# Patient Record
Sex: Male | Born: 1970 | Race: White | Hispanic: No | Smoking: Current every day smoker
Health system: Southern US, Community
[De-identification: ages and names within clinical notes are randomized; demographics above are authoritative.]

## PROBLEM LIST (undated history)

## (undated) ENCOUNTER — Emergency Department (HOSPITAL_COMMUNITY): Payer: Self-pay

## (undated) DIAGNOSIS — J439 Emphysema, unspecified: Secondary | ICD-10-CM

## (undated) DIAGNOSIS — IMO0002 Reserved for concepts with insufficient information to code with codable children: Secondary | ICD-10-CM

## (undated) DIAGNOSIS — W3400XA Accidental discharge from unspecified firearms or gun, initial encounter: Secondary | ICD-10-CM

## (undated) HISTORY — PX: KNEE SURGERY: SHX244

## (undated) HISTORY — PX: OTHER SURGICAL HISTORY: SHX169

---

## 1997-07-10 ENCOUNTER — Emergency Department (HOSPITAL_COMMUNITY): Admission: EM | Admit: 1997-07-10 | Discharge: 1997-07-10 | Payer: Self-pay | Admitting: Emergency Medicine

## 2000-10-25 ENCOUNTER — Encounter: Payer: Self-pay | Admitting: Emergency Medicine

## 2000-10-25 ENCOUNTER — Emergency Department (HOSPITAL_COMMUNITY): Admission: EM | Admit: 2000-10-25 | Discharge: 2000-10-25 | Payer: Self-pay | Admitting: Emergency Medicine

## 2002-03-01 ENCOUNTER — Emergency Department (HOSPITAL_COMMUNITY): Admission: EM | Admit: 2002-03-01 | Discharge: 2002-03-01 | Payer: Self-pay | Admitting: Unknown Physician Specialty

## 2002-03-01 ENCOUNTER — Encounter: Payer: Self-pay | Admitting: Emergency Medicine

## 2002-06-09 ENCOUNTER — Emergency Department (HOSPITAL_COMMUNITY): Admission: EM | Admit: 2002-06-09 | Discharge: 2002-06-09 | Payer: Self-pay | Admitting: Emergency Medicine

## 2003-01-28 ENCOUNTER — Emergency Department (HOSPITAL_COMMUNITY): Admission: EM | Admit: 2003-01-28 | Discharge: 2003-01-28 | Payer: Self-pay | Admitting: Emergency Medicine

## 2003-04-19 ENCOUNTER — Emergency Department (HOSPITAL_COMMUNITY): Admission: EM | Admit: 2003-04-19 | Discharge: 2003-04-20 | Payer: Self-pay

## 2004-01-30 ENCOUNTER — Emergency Department (HOSPITAL_COMMUNITY): Admission: EM | Admit: 2004-01-30 | Discharge: 2004-01-30 | Payer: Self-pay | Admitting: Emergency Medicine

## 2004-09-21 ENCOUNTER — Emergency Department (HOSPITAL_COMMUNITY): Admission: EM | Admit: 2004-09-21 | Discharge: 2004-09-21 | Payer: Self-pay | Admitting: Emergency Medicine

## 2004-09-23 ENCOUNTER — Emergency Department (HOSPITAL_COMMUNITY): Admission: EM | Admit: 2004-09-23 | Discharge: 2004-09-23 | Payer: Self-pay | Admitting: Emergency Medicine

## 2004-10-28 ENCOUNTER — Emergency Department (HOSPITAL_COMMUNITY): Admission: EM | Admit: 2004-10-28 | Discharge: 2004-10-28 | Payer: Self-pay | Admitting: Emergency Medicine

## 2005-03-29 ENCOUNTER — Emergency Department (HOSPITAL_COMMUNITY): Admission: EM | Admit: 2005-03-29 | Discharge: 2005-03-29 | Payer: Self-pay | Admitting: Emergency Medicine

## 2005-09-07 ENCOUNTER — Emergency Department (HOSPITAL_COMMUNITY): Admission: EM | Admit: 2005-09-07 | Discharge: 2005-09-07 | Payer: Self-pay | Admitting: Emergency Medicine

## 2005-10-19 ENCOUNTER — Emergency Department (HOSPITAL_COMMUNITY): Admission: EM | Admit: 2005-10-19 | Discharge: 2005-10-19 | Payer: Self-pay | Admitting: *Deleted

## 2007-12-27 ENCOUNTER — Emergency Department (HOSPITAL_COMMUNITY): Admission: EM | Admit: 2007-12-27 | Discharge: 2007-12-27 | Payer: Self-pay | Admitting: Emergency Medicine

## 2008-01-01 ENCOUNTER — Emergency Department (HOSPITAL_COMMUNITY): Admission: EM | Admit: 2008-01-01 | Discharge: 2008-01-01 | Payer: Self-pay | Admitting: Emergency Medicine

## 2008-12-17 ENCOUNTER — Emergency Department (HOSPITAL_COMMUNITY): Admission: EM | Admit: 2008-12-17 | Discharge: 2008-12-17 | Payer: Self-pay | Admitting: Emergency Medicine

## 2009-02-21 ENCOUNTER — Emergency Department (HOSPITAL_COMMUNITY): Admission: EM | Admit: 2009-02-21 | Discharge: 2009-02-21 | Payer: Self-pay | Admitting: Emergency Medicine

## 2009-03-24 ENCOUNTER — Emergency Department (HOSPITAL_COMMUNITY): Admission: EM | Admit: 2009-03-24 | Discharge: 2009-03-24 | Payer: Self-pay | Admitting: Emergency Medicine

## 2009-09-28 ENCOUNTER — Emergency Department (HOSPITAL_COMMUNITY): Admission: EM | Admit: 2009-09-28 | Discharge: 2009-09-28 | Payer: Self-pay | Admitting: Emergency Medicine

## 2010-02-20 ENCOUNTER — Emergency Department (HOSPITAL_COMMUNITY)
Admission: EM | Admit: 2010-02-20 | Discharge: 2010-02-20 | Payer: Self-pay | Source: Home / Self Care | Admitting: Emergency Medicine

## 2010-03-24 ENCOUNTER — Emergency Department (HOSPITAL_COMMUNITY): Payer: Self-pay

## 2010-03-24 ENCOUNTER — Emergency Department (HOSPITAL_COMMUNITY)
Admission: EM | Admit: 2010-03-24 | Discharge: 2010-03-24 | Disposition: A | Discharge status: Home / Self Care | Payer: Self-pay | Attending: Emergency Medicine | Admitting: Emergency Medicine

## 2010-03-24 DIAGNOSIS — R079 Chest pain, unspecified: Secondary | ICD-10-CM | POA: Insufficient documentation

## 2010-03-24 DIAGNOSIS — K219 Gastro-esophageal reflux disease without esophagitis: Secondary | ICD-10-CM | POA: Insufficient documentation

## 2010-03-24 DIAGNOSIS — M542 Cervicalgia: Secondary | ICD-10-CM | POA: Insufficient documentation

## 2010-04-10 ENCOUNTER — Emergency Department (HOSPITAL_COMMUNITY)
Admission: EM | Admit: 2010-04-10 | Discharge: 2010-04-10 | Disposition: A | Discharge status: Home / Self Care | Payer: Self-pay | Attending: Emergency Medicine | Admitting: Emergency Medicine

## 2010-04-10 DIAGNOSIS — F17200 Nicotine dependence, unspecified, uncomplicated: Secondary | ICD-10-CM | POA: Insufficient documentation

## 2010-04-10 DIAGNOSIS — R079 Chest pain, unspecified: Secondary | ICD-10-CM | POA: Insufficient documentation

## 2010-04-10 DIAGNOSIS — M79609 Pain in unspecified limb: Secondary | ICD-10-CM | POA: Insufficient documentation

## 2010-04-10 DIAGNOSIS — M542 Cervicalgia: Secondary | ICD-10-CM | POA: Insufficient documentation

## 2010-04-10 DIAGNOSIS — K219 Gastro-esophageal reflux disease without esophagitis: Secondary | ICD-10-CM | POA: Insufficient documentation

## 2010-04-10 DIAGNOSIS — Z79899 Other long term (current) drug therapy: Secondary | ICD-10-CM | POA: Insufficient documentation

## 2010-04-10 LAB — POCT CARDIAC MARKERS: Myoglobin, poc: 74.5 ng/mL (ref 12–200)

## 2010-04-10 LAB — POCT I-STAT, CHEM 8
BUN: 11 mg/dL (ref 6–23)
Chloride: 105 mEq/L (ref 96–112)
Creatinine, Ser: 1.2 mg/dL (ref 0.4–1.5)
HCT: 44 % (ref 39.0–52.0)
Hemoglobin: 15 g/dL (ref 13.0–17.0)
Potassium: 4 mEq/L (ref 3.5–5.1)
Sodium: 138 mEq/L (ref 135–145)

## 2010-04-10 LAB — D-DIMER, QUANTITATIVE: D-Dimer, Quant: <0.22 ug/mL-FEU (ref 0.00–0.48)

## 2010-04-29 ENCOUNTER — Emergency Department (HOSPITAL_COMMUNITY)
Admission: EM | Admit: 2010-04-29 | Discharge: 2010-04-29 | Discharge status: Against Medical Advice | Payer: Self-pay | Attending: Emergency Medicine | Admitting: Emergency Medicine

## 2010-04-29 ENCOUNTER — Emergency Department (HOSPITAL_COMMUNITY): Payer: Self-pay

## 2010-04-29 DIAGNOSIS — M25519 Pain in unspecified shoulder: Secondary | ICD-10-CM | POA: Insufficient documentation

## 2010-04-29 DIAGNOSIS — R0789 Other chest pain: Secondary | ICD-10-CM | POA: Insufficient documentation

## 2010-04-29 DIAGNOSIS — M542 Cervicalgia: Secondary | ICD-10-CM | POA: Insufficient documentation

## 2010-04-29 DIAGNOSIS — M546 Pain in thoracic spine: Secondary | ICD-10-CM | POA: Insufficient documentation

## 2010-04-29 DIAGNOSIS — K219 Gastro-esophageal reflux disease without esophagitis: Secondary | ICD-10-CM | POA: Insufficient documentation

## 2010-04-29 DIAGNOSIS — K279 Peptic ulcer, site unspecified, unspecified as acute or chronic, without hemorrhage or perforation: Secondary | ICD-10-CM | POA: Insufficient documentation

## 2010-08-10 ENCOUNTER — Emergency Department (HOSPITAL_COMMUNITY)
Admission: EM | Admit: 2010-08-10 | Discharge: 2010-08-10 | Disposition: A | Discharge status: Home / Self Care | Payer: Self-pay | Attending: Emergency Medicine | Admitting: Emergency Medicine

## 2010-08-10 DIAGNOSIS — L255 Unspecified contact dermatitis due to plants, except food: Secondary | ICD-10-CM | POA: Insufficient documentation

## 2010-08-10 DIAGNOSIS — R21 Rash and other nonspecific skin eruption: Secondary | ICD-10-CM | POA: Insufficient documentation

## 2010-08-10 DIAGNOSIS — K219 Gastro-esophageal reflux disease without esophagitis: Secondary | ICD-10-CM | POA: Insufficient documentation

## 2010-08-10 DIAGNOSIS — T622X1A Toxic effect of other ingested (parts of) plant(s), accidental (unintentional), initial encounter: Secondary | ICD-10-CM | POA: Insufficient documentation

## 2010-08-31 ENCOUNTER — Emergency Department (HOSPITAL_COMMUNITY)
Admission: EM | Admit: 2010-08-31 | Discharge: 2010-08-31 | Discharge status: Against Medical Advice | Payer: Self-pay | Attending: Emergency Medicine | Admitting: Emergency Medicine

## 2010-08-31 DIAGNOSIS — R109 Unspecified abdominal pain: Secondary | ICD-10-CM | POA: Insufficient documentation

## 2010-08-31 LAB — URINALYSIS, ROUTINE W REFLEX MICROSCOPIC
Bilirubin Urine: NEGATIVE
Glucose, UA: NEGATIVE mg/dL
Ketones, ur: NEGATIVE mg/dL
Protein, ur: NEGATIVE mg/dL

## 2010-09-05 ENCOUNTER — Emergency Department (HOSPITAL_COMMUNITY)
Admission: EM | Admit: 2010-09-05 | Discharge: 2010-09-05 | Disposition: A | Discharge status: Home / Self Care | Payer: Self-pay | Attending: Emergency Medicine | Admitting: Emergency Medicine

## 2010-09-05 DIAGNOSIS — R1110 Vomiting, unspecified: Secondary | ICD-10-CM | POA: Insufficient documentation

## 2010-09-05 DIAGNOSIS — K219 Gastro-esophageal reflux disease without esophagitis: Secondary | ICD-10-CM | POA: Insufficient documentation

## 2010-09-05 DIAGNOSIS — F17200 Nicotine dependence, unspecified, uncomplicated: Secondary | ICD-10-CM | POA: Insufficient documentation

## 2010-09-05 DIAGNOSIS — R079 Chest pain, unspecified: Secondary | ICD-10-CM | POA: Insufficient documentation

## 2010-09-05 DIAGNOSIS — R109 Unspecified abdominal pain: Secondary | ICD-10-CM | POA: Insufficient documentation

## 2010-09-05 LAB — URINALYSIS, ROUTINE W REFLEX MICROSCOPIC
Bilirubin Urine: NEGATIVE
Glucose, UA: NEGATIVE mg/dL
Hgb urine dipstick: NEGATIVE
Ketones, ur: NEGATIVE mg/dL
Protein, ur: NEGATIVE mg/dL
Specific Gravity, Urine: 1.008 (ref 1.005–1.030)
pH: 6.5 (ref 5.0–8.0)

## 2010-09-15 ENCOUNTER — Inpatient Hospital Stay (INDEPENDENT_AMBULATORY_CARE_PROVIDER_SITE_OTHER)
Admission: RE | Admit: 2010-09-15 | Discharge: 2010-09-15 | Disposition: A | Discharge status: Home / Self Care | Payer: Self-pay | Source: Ambulatory Visit | Attending: Family Medicine | Admitting: Family Medicine

## 2010-09-15 DIAGNOSIS — K219 Gastro-esophageal reflux disease without esophagitis: Secondary | ICD-10-CM

## 2010-09-15 LAB — POCT I-STAT, CHEM 8
Calcium, Ion: 1.14 mmol/L (ref 1.12–1.32)
Chloride: 105 mEq/L (ref 96–112)
Creatinine, Ser: 1.2 mg/dL (ref 0.50–1.35)
Hemoglobin: 15.6 g/dL (ref 13.0–17.0)
Potassium: 3.8 mEq/L (ref 3.5–5.1)
TCO2: 25 mmol/L (ref 0–100)

## 2010-11-05 ENCOUNTER — Emergency Department (HOSPITAL_COMMUNITY): Payer: Self-pay

## 2010-11-05 ENCOUNTER — Emergency Department (HOSPITAL_COMMUNITY)
Admission: EM | Admit: 2010-11-05 | Discharge: 2010-11-05 | Disposition: A | Discharge status: Home / Self Care | Payer: Self-pay | Attending: Emergency Medicine | Admitting: Emergency Medicine

## 2010-11-05 DIAGNOSIS — R059 Cough, unspecified: Secondary | ICD-10-CM | POA: Insufficient documentation

## 2010-11-05 DIAGNOSIS — R5381 Other malaise: Secondary | ICD-10-CM | POA: Insufficient documentation

## 2010-11-05 DIAGNOSIS — R05 Cough: Secondary | ICD-10-CM | POA: Insufficient documentation

## 2010-11-05 DIAGNOSIS — F17200 Nicotine dependence, unspecified, uncomplicated: Secondary | ICD-10-CM | POA: Insufficient documentation

## 2010-11-05 DIAGNOSIS — R0609 Other forms of dyspnea: Secondary | ICD-10-CM | POA: Insufficient documentation

## 2010-11-05 DIAGNOSIS — R071 Chest pain on breathing: Secondary | ICD-10-CM | POA: Insufficient documentation

## 2010-11-05 DIAGNOSIS — R0989 Other specified symptoms and signs involving the circulatory and respiratory systems: Secondary | ICD-10-CM | POA: Insufficient documentation

## 2010-11-05 DIAGNOSIS — IMO0001 Reserved for inherently not codable concepts without codable children: Secondary | ICD-10-CM | POA: Insufficient documentation

## 2010-11-05 DIAGNOSIS — B9789 Other viral agents as the cause of diseases classified elsewhere: Secondary | ICD-10-CM | POA: Insufficient documentation

## 2010-11-05 LAB — CBC
MCV: 93.2 fL (ref 78.0–100.0)
Platelets: 211 10*3/uL (ref 150–400)
RBC: 4.86 MIL/uL (ref 4.22–5.81)
RDW: 12.7 % (ref 11.5–15.5)
WBC: 8.3 10*3/uL (ref 4.0–10.5)

## 2010-11-05 LAB — DIFFERENTIAL
Eosinophils Relative: 2 % (ref 0–5)
Monocytes Absolute: 0.8 10*3/uL (ref 0.1–1.0)
Neutro Abs: 5.2 10*3/uL (ref 1.7–7.7)
Neutrophils Relative %: 63 % (ref 43–77)

## 2010-11-05 LAB — BASIC METABOLIC PANEL
Chloride: 103 mEq/L (ref 96–112)
GFR calc non Af Amer: >90 mL/min (ref >90)
Sodium: 136 mEq/L (ref 135–145)

## 2010-12-01 ENCOUNTER — Emergency Department (HOSPITAL_COMMUNITY)
Admission: EM | Admit: 2010-12-01 | Discharge: 2010-12-01 | Disposition: A | Discharge status: Home / Self Care | Payer: Self-pay | Attending: Emergency Medicine | Admitting: Emergency Medicine

## 2010-12-01 ENCOUNTER — Encounter: Payer: Self-pay | Admitting: *Deleted

## 2010-12-01 DIAGNOSIS — R21 Rash and other nonspecific skin eruption: Secondary | ICD-10-CM | POA: Insufficient documentation

## 2010-12-01 DIAGNOSIS — B354 Tinea corporis: Secondary | ICD-10-CM | POA: Insufficient documentation

## 2010-12-01 HISTORY — DX: Accidental discharge from unspecified firearms or gun, initial encounter: W34.00XA
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
## 2010-12-01 NOTE — ED Notes (Signed)
Pt presents to department for evaluation of possible ringworm. Ongoing x3 weeks, was told this could be what he has. No relief from itching and irritation. Lesions noted to bilateral inner thigh areas. 4/10 discomfort at the time. Pt alert and oriented x4. No signs of distress.

## 2010-12-01 NOTE — ED Notes (Signed)
Pt states he does not want prescription, will continue using creams at home. Left without signing discharge instructions. EDP at bedside.

## 2011-04-03 ENCOUNTER — Encounter (HOSPITAL_COMMUNITY): Payer: Self-pay | Admitting: *Deleted

## 2011-04-03 ENCOUNTER — Other Ambulatory Visit: Payer: Self-pay

## 2011-04-03 ENCOUNTER — Emergency Department (HOSPITAL_COMMUNITY)
Admission: EM | Admit: 2011-04-03 | Discharge: 2011-04-03 | Disposition: A | Discharge status: Home / Self Care | Payer: Self-pay | Attending: Emergency Medicine | Admitting: Emergency Medicine

## 2011-04-03 ENCOUNTER — Emergency Department (HOSPITAL_COMMUNITY): Payer: Self-pay

## 2011-04-03 DIAGNOSIS — J189 Pneumonia, unspecified organism: Secondary | ICD-10-CM | POA: Insufficient documentation

## 2011-04-03 DIAGNOSIS — R059 Cough, unspecified: Secondary | ICD-10-CM | POA: Insufficient documentation

## 2011-04-03 DIAGNOSIS — F17200 Nicotine dependence, unspecified, uncomplicated: Secondary | ICD-10-CM | POA: Insufficient documentation

## 2011-04-03 DIAGNOSIS — R109 Unspecified abdominal pain: Secondary | ICD-10-CM | POA: Insufficient documentation

## 2011-04-03 DIAGNOSIS — Z79899 Other long term (current) drug therapy: Secondary | ICD-10-CM | POA: Insufficient documentation

## 2011-04-03 DIAGNOSIS — R05 Cough: Secondary | ICD-10-CM | POA: Insufficient documentation

## 2011-04-03 HISTORY — DX: Reserved for concepts with insufficient information to code with codable children: IMO0002

## 2011-04-03 LAB — COMPREHENSIVE METABOLIC PANEL
ALT: 39 U/L (ref 0–53)
AST: 30 U/L (ref 0–37)
Albumin: 3.8 g/dL (ref 3.5–5.2)
Alkaline Phosphatase: 63 U/L (ref 39–117)
Chloride: 103 mEq/L (ref 96–112)
Potassium: 4 mEq/L (ref 3.5–5.1)
Sodium: 138 mEq/L (ref 135–145)
Total Bilirubin: 0.2 mg/dL — ABNORMAL LOW (ref 0.3–1.2)
Total Protein: 7.1 g/dL (ref 6.0–8.3)

## 2011-04-03 LAB — CBC
Hemoglobin: 15.5 g/dL (ref 13.0–17.0)
MCH: 32.7 pg (ref 26.0–34.0)
MCHC: 35.1 g/dL (ref 30.0–36.0)
Platelets: 215 10*3/uL (ref 150–400)
RBC: 4.74 MIL/uL (ref 4.22–5.81)

## 2011-04-03 LAB — DIFFERENTIAL
Basophils Relative: 1 % (ref 0–1)
Eosinophils Absolute: 0.1 10*3/uL (ref 0.0–0.7)
Monocytes Relative: 8 % (ref 3–12)
Neutro Abs: 3.3 10*3/uL (ref 1.7–7.7)
Neutrophils Relative %: 47 % (ref 43–77)

## 2011-04-03 MED ORDER — AZITHROMYCIN 250 MG PO TABS: 500.0000 mg | ORAL_TABLET | Freq: Every day | ORAL | Status: AC

## 2011-04-03 MED ORDER — AZITHROMYCIN 250 MG PO TABS
500.0000 mg | ORAL_TABLET | Freq: Once | ORAL | Status: AC
  Administered 2011-04-03: 500 mg via ORAL
  Filled 2011-04-03: qty 2

## 2011-04-03 MED ORDER — GI COCKTAIL ~~LOC~~
30.0000 mL | Freq: Once | ORAL | Status: AC
  Administered 2011-04-03: 30 mL via ORAL
  Filled 2011-04-03: qty 30

## 2011-04-03 NOTE — ED Notes (Signed)
The pt has had epigastric pain for the past 2 days with n v.  The pain has advanced up his lt chest and into his neck intermittently.  No cardiac history
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
## 2011-04-03 NOTE — Discharge Instructions (Signed)

## 2012-07-29 ENCOUNTER — Emergency Department (HOSPITAL_COMMUNITY)
Admission: EM | Admit: 2012-07-29 | Discharge: 2012-07-29 | Disposition: A | Discharge status: Home / Self Care | Payer: Self-pay | Attending: Emergency Medicine | Admitting: Emergency Medicine

## 2012-07-29 ENCOUNTER — Encounter (HOSPITAL_COMMUNITY): Payer: Self-pay | Admitting: Emergency Medicine

## 2012-07-29 DIAGNOSIS — G8929 Other chronic pain: Secondary | ICD-10-CM | POA: Insufficient documentation

## 2012-07-29 DIAGNOSIS — M7989 Other specified soft tissue disorders: Secondary | ICD-10-CM | POA: Insufficient documentation

## 2012-07-29 DIAGNOSIS — R079 Chest pain, unspecified: Secondary | ICD-10-CM | POA: Insufficient documentation

## 2012-07-29 DIAGNOSIS — Z8719 Personal history of other diseases of the digestive system: Secondary | ICD-10-CM | POA: Insufficient documentation

## 2012-07-29 DIAGNOSIS — Z79899 Other long term (current) drug therapy: Secondary | ICD-10-CM | POA: Insufficient documentation

## 2012-07-29 DIAGNOSIS — M79609 Pain in unspecified limb: Secondary | ICD-10-CM

## 2012-07-29 DIAGNOSIS — F17200 Nicotine dependence, unspecified, uncomplicated: Secondary | ICD-10-CM | POA: Insufficient documentation

## 2012-07-29 DIAGNOSIS — Z87828 Personal history of other (healed) physical injury and trauma: Secondary | ICD-10-CM | POA: Insufficient documentation

## 2012-07-29 DIAGNOSIS — R109 Unspecified abdominal pain: Secondary | ICD-10-CM | POA: Insufficient documentation

## 2012-07-29 MED ORDER — HYDROCODONE-ACETAMINOPHEN 5-325 MG PO TABS: 1.0000 | ORAL_TABLET | ORAL | Status: DC | PRN
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
## 2012-07-29 NOTE — ED Notes (Signed)
Venous Duplex study being done at this time.

## 2012-07-29 NOTE — ED Provider Notes (Signed)
Medical screening examination/treatment/procedure(s) were performed by non-physician practitioner and as supervising physician I was immediately available for consultation/collaboration.  Doug Sou, MD 07/29/12 [phone]
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
## 2012-07-29 NOTE — ED Notes (Signed)
Pt reports redness and pain to left leg that started 3 days ago. Pt reports pain from Upper leg to to lower leg. Pt took 5 mg Vicodin at 1230 and pain is reduced from 8/10 to 4/10 now. Pt ambulated with a limp.

## 2012-07-30 NOTE — ED Provider Notes (Signed)
Medical screening examination/treatment/procedure(s) were performed by non-physician practitioner and as supervising physician I was immediately available for consultation/collaboration.  Doug Sou, MD 07/30/12 1544

## 2012-07-31 ENCOUNTER — Encounter (HOSPITAL_COMMUNITY): Payer: Self-pay | Admitting: Emergency Medicine

## 2012-07-31 ENCOUNTER — Emergency Department (HOSPITAL_COMMUNITY)
Admission: EM | Admit: 2012-07-31 | Discharge: 2012-07-31 | Disposition: A | Discharge status: Home / Self Care | Payer: Self-pay | Attending: Emergency Medicine | Admitting: Emergency Medicine

## 2012-07-31 DIAGNOSIS — M79605 Pain in left leg: Secondary | ICD-10-CM

## 2012-07-31 DIAGNOSIS — Z79899 Other long term (current) drug therapy: Secondary | ICD-10-CM | POA: Insufficient documentation

## 2012-07-31 DIAGNOSIS — R000 Tachycardia, unspecified: Secondary | ICD-10-CM | POA: Insufficient documentation

## 2012-07-31 DIAGNOSIS — F17200 Nicotine dependence, unspecified, uncomplicated: Secondary | ICD-10-CM | POA: Insufficient documentation

## 2012-07-31 DIAGNOSIS — G8929 Other chronic pain: Secondary | ICD-10-CM | POA: Insufficient documentation

## 2012-07-31 DIAGNOSIS — M79609 Pain in unspecified limb: Secondary | ICD-10-CM

## 2012-07-31 MED ORDER — IBUPROFEN 800 MG PO TABS: 800.0000 mg | ORAL_TABLET | Freq: Three times a day (TID) | ORAL | Status: DC

## 2012-07-31 NOTE — ED Notes (Signed)
Vascular surgery at bedside for evaluation
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
## 2012-08-01 NOTE — ED Provider Notes (Signed)
Medical screening examination/treatment/procedure(s) were performed by non-physician practitioner and as supervising physician I was immediately available for consultation/collaboration.  Tamaya Pun R. Yoshimi Sarr, MD 08/01/12 0738 

## 2012-09-14 ENCOUNTER — Emergency Department (HOSPITAL_COMMUNITY): Payer: Self-pay

## 2012-09-14 ENCOUNTER — Encounter (HOSPITAL_COMMUNITY): Payer: Self-pay | Admitting: *Deleted

## 2012-09-14 ENCOUNTER — Emergency Department (HOSPITAL_COMMUNITY)
Admission: EM | Admit: 2012-09-14 | Discharge: 2012-09-14 | Disposition: A | Discharge status: Home / Self Care | Payer: Self-pay | Attending: Emergency Medicine | Admitting: Emergency Medicine

## 2012-09-14 DIAGNOSIS — Z8679 Personal history of other diseases of the circulatory system: Secondary | ICD-10-CM | POA: Insufficient documentation

## 2012-09-14 DIAGNOSIS — F1410 Cocaine abuse, uncomplicated: Secondary | ICD-10-CM | POA: Insufficient documentation

## 2012-09-14 DIAGNOSIS — Z87828 Personal history of other (healed) physical injury and trauma: Secondary | ICD-10-CM | POA: Insufficient documentation

## 2012-09-14 DIAGNOSIS — R05 Cough: Secondary | ICD-10-CM | POA: Insufficient documentation

## 2012-09-14 DIAGNOSIS — R059 Cough, unspecified: Secondary | ICD-10-CM | POA: Insufficient documentation

## 2012-09-14 DIAGNOSIS — Z79899 Other long term (current) drug therapy: Secondary | ICD-10-CM | POA: Insufficient documentation

## 2012-09-14 DIAGNOSIS — F17200 Nicotine dependence, unspecified, uncomplicated: Secondary | ICD-10-CM | POA: Insufficient documentation

## 2012-09-14 DIAGNOSIS — R079 Chest pain, unspecified: Secondary | ICD-10-CM | POA: Insufficient documentation

## 2012-09-14 DIAGNOSIS — Z872 Personal history of diseases of the skin and subcutaneous tissue: Secondary | ICD-10-CM | POA: Insufficient documentation

## 2012-09-14 DIAGNOSIS — R000 Tachycardia, unspecified: Secondary | ICD-10-CM | POA: Insufficient documentation

## 2012-09-14 LAB — CBC
HCT: 41.5 % (ref 39.0–52.0)
Hemoglobin: 14.8 g/dL (ref 13.0–17.0)
MCH: 33.6 pg (ref 26.0–34.0)
MCHC: 35.7 g/dL (ref 30.0–36.0)
MCV: 94.3 fL (ref 78.0–100.0)
RDW: 12.6 % (ref 11.5–15.5)

## 2012-09-14 LAB — POCT I-STAT TROPONIN I

## 2012-09-14 LAB — POCT I-STAT, CHEM 8
BUN: 14 mg/dL (ref 6–23)
Calcium, Ion: 1.23 mmol/L (ref 1.12–1.23)
Creatinine, Ser: 1.4 mg/dL — ABNORMAL HIGH (ref 0.50–1.35)
Glucose, Bld: 79 mg/dL (ref 70–99)
Sodium: 140 mEq/L (ref 135–145)
TCO2: 27 mmol/L (ref 0–100)

## 2012-09-14 LAB — BASIC METABOLIC PANEL
CO2: 26 mEq/L (ref 19–32)
Chloride: 101 mEq/L (ref 96–112)
Creatinine, Ser: 1.28 mg/dL (ref 0.50–1.35)
Glucose, Bld: 70 mg/dL (ref 70–99)

## 2012-09-14 MED ORDER — SODIUM CHLORIDE 0.9 % IV BOLUS (SEPSIS)
1000.0000 mL | Freq: Once | INTRAVENOUS | Status: AC
  Administered 2012-09-14: 1000 mL via INTRAVENOUS

## 2012-09-14 MED ORDER — IOHEXOL 350 MG/ML SOLN
80.0000 mL | Freq: Once | INTRAVENOUS | Status: AC | PRN
  Administered 2012-09-14: 80 mL via INTRAVENOUS

## 2012-09-14 NOTE — ED Notes (Signed)
Pt states he has been to ED MANY times for same complaint.  L pectoral muscle "burns" or feels like "there is something there".  Pt refuses ekg b/c he states every time the has one it is normal.  Complains also of dry cough and bil scapular pain.  Pain increases with activity.  States every time he comes to ED we refer him to a specialist but he does not have a job and cannot pay, so has not seen a specialist.  He requests and X-ray or MRI.

## 2012-09-14 NOTE — ED Notes (Signed)
Was able to convince to to allow ED EKG after discussing benefits and risks of procedure
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
## 2012-09-14 NOTE — ED Notes (Signed)
Pt scanning pad not working.  Pt unable to sign for discharge

## 2013-06-10 ENCOUNTER — Emergency Department (HOSPITAL_COMMUNITY)
Admission: EM | Admit: 2013-06-10 | Discharge: 2013-06-10 | Disposition: A | Discharge status: Home / Self Care | Payer: Self-pay | Attending: Emergency Medicine | Admitting: Emergency Medicine

## 2013-06-10 ENCOUNTER — Emergency Department (HOSPITAL_COMMUNITY): Payer: Self-pay

## 2013-06-10 ENCOUNTER — Encounter (HOSPITAL_COMMUNITY): Payer: Self-pay | Admitting: Emergency Medicine

## 2013-06-10 DIAGNOSIS — Z87828 Personal history of other (healed) physical injury and trauma: Secondary | ICD-10-CM | POA: Insufficient documentation

## 2013-06-10 DIAGNOSIS — R1033 Periumbilical pain: Secondary | ICD-10-CM | POA: Insufficient documentation

## 2013-06-10 DIAGNOSIS — R1110 Vomiting, unspecified: Secondary | ICD-10-CM | POA: Insufficient documentation

## 2013-06-10 DIAGNOSIS — Z79899 Other long term (current) drug therapy: Secondary | ICD-10-CM | POA: Insufficient documentation

## 2013-06-10 DIAGNOSIS — Z872 Personal history of diseases of the skin and subcutaneous tissue: Secondary | ICD-10-CM | POA: Insufficient documentation

## 2013-06-10 DIAGNOSIS — R51 Headache: Secondary | ICD-10-CM | POA: Insufficient documentation

## 2013-06-10 DIAGNOSIS — R1011 Right upper quadrant pain: Secondary | ICD-10-CM | POA: Insufficient documentation

## 2013-06-10 DIAGNOSIS — F17200 Nicotine dependence, unspecified, uncomplicated: Secondary | ICD-10-CM | POA: Insufficient documentation

## 2013-06-10 DIAGNOSIS — R109 Unspecified abdominal pain: Secondary | ICD-10-CM

## 2013-06-10 LAB — COMPREHENSIVE METABOLIC PANEL
ALBUMIN: 3.6 g/dL (ref 3.5–5.2)
ALK PHOS: 59 U/L (ref 39–117)
ALT: 23 U/L (ref 0–53)
AST: 24 U/L (ref 0–37)
BILIRUBIN TOTAL: 0.4 mg/dL (ref 0.3–1.2)
BUN: 15 mg/dL (ref 6–23)
CHLORIDE: 102 meq/L (ref 96–112)
CO2: 23 meq/L (ref 19–32)
Calcium: 8.7 mg/dL (ref 8.4–10.5)
Creatinine, Ser: 1.14 mg/dL (ref 0.50–1.35)
GFR calc Af Amer: >90 mL/min (ref >90)
GFR, EST NON AFRICAN AMERICAN: 79 mL/min — AB (ref >90)
Glucose, Bld: 88 mg/dL (ref 70–99)
POTASSIUM: 4.1 meq/L (ref 3.7–5.3)
SODIUM: 138 meq/L (ref 137–147)
Total Protein: 6.5 g/dL (ref 6.0–8.3)

## 2013-06-10 LAB — URINALYSIS, ROUTINE W REFLEX MICROSCOPIC
BILIRUBIN URINE: NEGATIVE
Glucose, UA: NEGATIVE mg/dL
Hgb urine dipstick: NEGATIVE
KETONES UR: NEGATIVE mg/dL
Leukocytes, UA: NEGATIVE
NITRITE: NEGATIVE
PH: 6.5 (ref 5.0–8.0)
PROTEIN: NEGATIVE mg/dL
Specific Gravity, Urine: 1.028 (ref 1.005–1.030)
Urobilinogen, UA: 0.2 mg/dL (ref 0.0–1.0)

## 2013-06-10 LAB — CBC WITH DIFFERENTIAL/PLATELET
BASOS ABS: 0.1 10*3/uL (ref 0.0–0.1)
Basophils Relative: 1 % (ref 0–1)
EOS ABS: 0.2 10*3/uL (ref 0.0–0.7)
EOS PCT: 2 % (ref 0–5)
HEMATOCRIT: 43.2 % (ref 39.0–52.0)
Hemoglobin: 14.5 g/dL (ref 13.0–17.0)
LYMPHS PCT: 33 % (ref 12–46)
Lymphs Abs: 2.4 10*3/uL (ref 0.7–4.0)
MCH: 32.4 pg (ref 26.0–34.0)
MCHC: 33.6 g/dL (ref 30.0–36.0)
MCV: 96.6 fL (ref 78.0–100.0)
MONO ABS: 0.5 10*3/uL (ref 0.1–1.0)
Monocytes Relative: 7 % (ref 3–12)
Neutro Abs: 4.3 10*3/uL (ref 1.7–7.7)
Neutrophils Relative %: 57 % (ref 43–77)
PLATELETS: 204 10*3/uL (ref 150–400)
RBC: 4.47 MIL/uL (ref 4.22–5.81)
RDW: 12.7 % (ref 11.5–15.5)
WBC: 7.5 10*3/uL (ref 4.0–10.5)

## 2013-06-10 LAB — LIPASE, BLOOD: Lipase: 34 U/L (ref 11–59)

## 2013-06-10 MED ORDER — SODIUM CHLORIDE 0.9 % IV BOLUS (SEPSIS)
500.0000 mL | Freq: Once | INTRAVENOUS | Status: AC
  Administered 2013-06-10: 500 mL via INTRAVENOUS

## 2013-06-10 MED ORDER — HYDROCODONE-ACETAMINOPHEN 5-325 MG PO TABS: 1.0000 | ORAL_TABLET | Freq: Four times a day (QID) | ORAL | Status: DC | PRN

## 2013-06-10 MED ORDER — ONDANSETRON HCL 4 MG/2ML IJ SOLN
4.0000 mg | Freq: Once | INTRAMUSCULAR | Status: AC
  Administered 2013-06-10: 4 mg via INTRAVENOUS
  Filled 2013-06-10: qty 2

## 2013-06-10 MED ORDER — IOHEXOL 300 MG/ML  SOLN
100.0000 mL | Freq: Once | INTRAMUSCULAR | Status: AC | PRN
  Administered 2013-06-10: 100 mL via INTRAVENOUS

## 2013-06-10 MED ORDER — SODIUM CHLORIDE 0.9 % IV SOLN
INTRAVENOUS | Status: DC
  Administered 2013-06-10: 10:00:00 via INTRAVENOUS

## 2013-06-10 MED ORDER — IOHEXOL 300 MG/ML  SOLN: 25.0000 mL | Freq: Once | INTRAMUSCULAR | Status: DC | PRN

## 2013-06-10 MED ORDER — FAMOTIDINE 20 MG PO TABS: 20.0000 mg | ORAL_TABLET | Freq: Two times a day (BID) | ORAL | Status: DC

## 2013-06-10 MED ORDER — HYDROMORPHONE HCL PF 1 MG/ML IJ SOLN
1.0000 mg | Freq: Once | INTRAMUSCULAR | Status: DC
  Filled 2013-06-10: qty 1

## 2013-06-10 NOTE — Discharge Instructions (Signed)
Extensive workup for the abdominal pain without any significant findings. Head CT was also normal. Will give you a trial of Pepcid this maybe stomach ulcer problem. Also pain medication as needed. Resource guide provided to help you find a record Dr. Return for any newer worse symptoms.

## 2013-06-10 NOTE — ED Notes (Signed)
Labs delayed due to pt not available in the room.

## 2013-06-10 NOTE — ED Notes (Signed)
Pt returned from CT °
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
## 2013-06-10 NOTE — ED Notes (Signed)
Pt presents from home with c/o of lower abdominal pain x 1 week and pt feels he has a mass underneath his right rib cage that has been ongoing x 3 weeks.

## 2013-06-10 NOTE — ED Notes (Signed)
Pt has tenderness to the right lower rib cage.  Pt denies any injuries.

## 2013-07-14 ENCOUNTER — Emergency Department (HOSPITAL_COMMUNITY): Payer: Self-pay

## 2013-07-14 ENCOUNTER — Emergency Department (HOSPITAL_COMMUNITY)
Admission: EM | Admit: 2013-07-14 | Discharge: 2013-07-14 | Disposition: A | Discharge status: Home / Self Care | Payer: Self-pay | Attending: Emergency Medicine | Admitting: Emergency Medicine

## 2013-07-14 ENCOUNTER — Encounter (HOSPITAL_COMMUNITY): Payer: Self-pay | Admitting: Emergency Medicine

## 2013-07-14 DIAGNOSIS — Y929 Unspecified place or not applicable: Secondary | ICD-10-CM | POA: Insufficient documentation

## 2013-07-14 DIAGNOSIS — M542 Cervicalgia: Secondary | ICD-10-CM | POA: Insufficient documentation

## 2013-07-14 DIAGNOSIS — M25519 Pain in unspecified shoulder: Secondary | ICD-10-CM | POA: Insufficient documentation

## 2013-07-14 DIAGNOSIS — Z87828 Personal history of other (healed) physical injury and trauma: Secondary | ICD-10-CM | POA: Insufficient documentation

## 2013-07-14 DIAGNOSIS — Z872 Personal history of diseases of the skin and subcutaneous tissue: Secondary | ICD-10-CM | POA: Insufficient documentation

## 2013-07-14 DIAGNOSIS — W010XXA Fall on same level from slipping, tripping and stumbling without subsequent striking against object, initial encounter: Secondary | ICD-10-CM | POA: Insufficient documentation

## 2013-07-14 DIAGNOSIS — T148XXA Other injury of unspecified body region, initial encounter: Secondary | ICD-10-CM | POA: Insufficient documentation

## 2013-07-14 DIAGNOSIS — Y9389 Activity, other specified: Secondary | ICD-10-CM | POA: Insufficient documentation

## 2013-07-14 DIAGNOSIS — F17200 Nicotine dependence, unspecified, uncomplicated: Secondary | ICD-10-CM | POA: Insufficient documentation

## 2013-07-14 MED ORDER — DIAZEPAM 5 MG PO TABS: 5.0000 mg | ORAL_TABLET | Freq: Two times a day (BID) | ORAL | Status: DC

## 2013-07-14 NOTE — ED Notes (Signed)
Patient states he was sitting in recliner and went to get up, dropped the remote and quickly went to retrieve.  When he bent down, he slipped on a rug and broke his fall with hands.   Patient states since then (Thursday) he has had bilateral shoulder pain, back pain and neck pain.  Patient states he got some "vicodin and muscle relaxant from a friend.   But it doesn't work".   Patient did drive to ED today.

## 2013-07-14 NOTE — ED Provider Notes (Signed)
Medical screening examination/treatment/procedure(s) were performed by non-physician practitioner and as supervising physician I was immediately available for consultation/collaboration.   EKG Interpretation None        Kristen N Ward, DO 07/14/13 1224 
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
## 2013-07-14 NOTE — Discharge Instructions (Signed)

## 2014-03-30 ENCOUNTER — Emergency Department (HOSPITAL_COMMUNITY)
Admission: EM | Admit: 2014-03-30 | Discharge: 2014-03-30 | Disposition: A | Discharge status: Home / Self Care | Payer: Self-pay | Attending: Emergency Medicine | Admitting: Emergency Medicine

## 2014-03-30 ENCOUNTER — Emergency Department (HOSPITAL_COMMUNITY): Payer: Self-pay

## 2014-03-30 ENCOUNTER — Encounter (HOSPITAL_COMMUNITY): Payer: Self-pay | Admitting: Emergency Medicine

## 2014-03-30 DIAGNOSIS — R05 Cough: Secondary | ICD-10-CM

## 2014-03-30 DIAGNOSIS — R059 Cough, unspecified: Secondary | ICD-10-CM

## 2014-03-30 DIAGNOSIS — Z79899 Other long term (current) drug therapy: Secondary | ICD-10-CM | POA: Insufficient documentation

## 2014-03-30 DIAGNOSIS — Z87828 Personal history of other (healed) physical injury and trauma: Secondary | ICD-10-CM | POA: Insufficient documentation

## 2014-03-30 DIAGNOSIS — Z720 Tobacco use: Secondary | ICD-10-CM | POA: Insufficient documentation

## 2014-03-30 DIAGNOSIS — J039 Acute tonsillitis, unspecified: Secondary | ICD-10-CM | POA: Insufficient documentation

## 2014-03-30 DIAGNOSIS — M542 Cervicalgia: Secondary | ICD-10-CM

## 2014-03-30 DIAGNOSIS — Z8719 Personal history of other diseases of the digestive system: Secondary | ICD-10-CM | POA: Insufficient documentation

## 2014-03-30 LAB — CBC WITH DIFFERENTIAL/PLATELET
BASOS ABS: 0 10*3/uL (ref 0.0–0.1)
Basophils Relative: 0 % (ref 0–1)
EOS ABS: 0.1 10*3/uL (ref 0.0–0.7)
EOS PCT: 1 % (ref 0–5)
HCT: 44.4 % (ref 39.0–52.0)
Hemoglobin: 15.2 g/dL (ref 13.0–17.0)
LYMPHS PCT: 18 % (ref 12–46)
Lymphs Abs: 1.8 10*3/uL (ref 0.7–4.0)
MCH: 32.5 pg (ref 26.0–34.0)
MCHC: 34.2 g/dL (ref 30.0–36.0)
MCV: 94.9 fL (ref 78.0–100.0)
Monocytes Absolute: 1 10*3/uL (ref 0.1–1.0)
Monocytes Relative: 10 % (ref 3–12)
NEUTROS ABS: 7.3 10*3/uL (ref 1.7–7.7)
NEUTROS PCT: 71 % (ref 43–77)
PLATELETS: 212 10*3/uL (ref 150–400)
RBC: 4.68 MIL/uL (ref 4.22–5.81)
RDW: 12.6 % (ref 11.5–15.5)
WBC: 10.3 10*3/uL (ref 4.0–10.5)

## 2014-03-30 LAB — BASIC METABOLIC PANEL
ANION GAP: 5 (ref 5–15)
BUN: 9 mg/dL (ref 6–23)
CO2: 27 mmol/L (ref 19–32)
Calcium: 8.7 mg/dL (ref 8.4–10.5)
Chloride: 105 mmol/L (ref 96–112)
Creatinine, Ser: 1.12 mg/dL (ref 0.50–1.35)
GFR calc Af Amer: >90 mL/min (ref >90)
GFR calc non Af Amer: 80 mL/min — ABNORMAL LOW (ref >90)
GLUCOSE: 103 mg/dL — AB (ref 70–99)
Potassium: 4.3 mmol/L (ref 3.5–5.1)
Sodium: 137 mmol/L (ref 135–145)

## 2014-03-30 LAB — RAPID STREP SCREEN (MED CTR MEBANE ONLY): STREPTOCOCCUS, GROUP A SCREEN (DIRECT): NEGATIVE

## 2014-03-30 MED ORDER — MORPHINE SULFATE 4 MG/ML IJ SOLN
4.0000 mg | Freq: Once | INTRAMUSCULAR | Status: AC
  Administered 2014-03-30: 4 mg via INTRAVENOUS
  Filled 2014-03-30: qty 1

## 2014-03-30 MED ORDER — AMOXICILLIN 500 MG PO CAPS
500.0000 mg | ORAL_CAPSULE | Freq: Once | ORAL | Status: AC
  Administered 2014-03-30: 500 mg via ORAL
  Filled 2014-03-30: qty 1

## 2014-03-30 MED ORDER — AMOXICILLIN 500 MG PO CAPS: 500.0000 mg | ORAL_CAPSULE | Freq: Three times a day (TID) | ORAL | Status: DC

## 2014-03-30 MED ORDER — ONDANSETRON HCL 4 MG/2ML IJ SOLN
4.0000 mg | Freq: Once | INTRAMUSCULAR | Status: AC
  Administered 2014-03-30: 4 mg via INTRAVENOUS
  Filled 2014-03-30: qty 2

## 2014-03-30 MED ORDER — SODIUM CHLORIDE 0.9 % IV BOLUS (SEPSIS)
1000.0000 mL | Freq: Once | INTRAVENOUS | Status: AC
  Administered 2014-03-30: 1000 mL via INTRAVENOUS

## 2014-03-30 MED ORDER — HYDROCODONE-ACETAMINOPHEN 5-325 MG PO TABS
2.0000 | ORAL_TABLET | Freq: Once | ORAL | Status: AC
  Administered 2014-03-30: 2 via ORAL
  Filled 2014-03-30: qty 2

## 2014-03-30 MED ORDER — IOHEXOL 300 MG/ML  SOLN
75.0000 mL | Freq: Once | INTRAMUSCULAR | Status: AC | PRN
  Administered 2014-03-30: 75 mL via INTRAVENOUS

## 2014-03-30 MED ORDER — HYDROCODONE-ACETAMINOPHEN 5-325 MG PO TABS: 2.0000 | ORAL_TABLET | ORAL | Status: DC | PRN

## 2014-03-30 NOTE — ED Notes (Signed)
Patient presents with sore throat x3 days. Patient reports pain on swallowing and coughing up blood. Patient also presents with headache x1 month which he has not been evaluated for d/t lack of insurance. Denies n/v, photophobia and phonophobia.

## 2014-03-30 NOTE — ED Notes (Signed)
Patient transported to X-ray 

## 2014-03-30 NOTE — Discharge Instructions (Signed)
Take amoxicillin as directed until gone. Refer to attached documents for more information. Return to the ED with worsening or concerning symptoms.  °
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
## 2014-04-02 LAB — CULTURE, GROUP A STREP

## 2014-04-14 ENCOUNTER — Encounter (HOSPITAL_COMMUNITY): Payer: Self-pay | Admitting: *Deleted

## 2014-04-14 ENCOUNTER — Emergency Department (HOSPITAL_COMMUNITY)
Admission: EM | Admit: 2014-04-14 | Discharge: 2014-04-14 | Disposition: A | Discharge status: Home / Self Care | Payer: Self-pay | Attending: Emergency Medicine | Admitting: Emergency Medicine

## 2014-04-14 DIAGNOSIS — Z792 Long term (current) use of antibiotics: Secondary | ICD-10-CM | POA: Insufficient documentation

## 2014-04-14 DIAGNOSIS — Z720 Tobacco use: Secondary | ICD-10-CM | POA: Insufficient documentation

## 2014-04-14 DIAGNOSIS — Z202 Contact with and (suspected) exposure to infections with a predominantly sexual mode of transmission: Secondary | ICD-10-CM | POA: Insufficient documentation

## 2014-04-14 DIAGNOSIS — Z872 Personal history of diseases of the skin and subcutaneous tissue: Secondary | ICD-10-CM | POA: Insufficient documentation

## 2014-04-14 DIAGNOSIS — Z79899 Other long term (current) drug therapy: Secondary | ICD-10-CM | POA: Insufficient documentation

## 2014-04-14 DIAGNOSIS — Z87828 Personal history of other (healed) physical injury and trauma: Secondary | ICD-10-CM | POA: Insufficient documentation

## 2014-04-14 MED ORDER — CEFTRIAXONE SODIUM 250 MG IJ SOLR
250.0000 mg | Freq: Once | INTRAMUSCULAR | Status: AC
  Administered 2014-04-14: 250 mg via INTRAMUSCULAR
  Filled 2014-04-14: qty 250

## 2014-04-14 MED ORDER — METRONIDAZOLE 500 MG PO TABS
2000.0000 mg | ORAL_TABLET | Freq: Once | ORAL | Status: AC
  Administered 2014-04-14: 2000 mg via ORAL
  Filled 2014-04-14: qty 4

## 2014-04-14 MED ORDER — AZITHROMYCIN 250 MG PO TABS
1000.0000 mg | ORAL_TABLET | Freq: Once | ORAL | Status: AC
  Administered 2014-04-14: 1000 mg via ORAL
  Filled 2014-04-14: qty 4

## 2014-04-14 MED ORDER — STERILE WATER FOR INJECTION IJ SOLN
INTRAMUSCULAR | Status: AC
  Administered 2014-04-14: 10 mL
  Filled 2014-04-14: qty 10

## 2014-04-14 MED ORDER — ONDANSETRON 4 MG PO TBDP
8.0000 mg | ORAL_TABLET | Freq: Once | ORAL | Status: AC
  Administered 2014-04-14: 8 mg via ORAL
  Filled 2014-04-14: qty 2
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
## 2014-04-14 NOTE — ED Notes (Signed)
Declined W/C at D/C and was escorted to lobby by RN. 

## 2014-04-14 NOTE — ED Notes (Signed)
Pt reports being exposed to std, denies any symptoms.  

## 2014-04-14 NOTE — Discharge Instructions (Signed)
Read the information below.  You may return to the Emergency Department at any time for worsening condition or any new symptoms that concern you.   Sexually Transmitted Disease A sexually transmitted disease (STD) is a disease or infection that may be passed (transmitted) from person to person, usually during sexual activity. This may happen by way of saliva, semen, blood, vaginal mucus, or urine. Common STDs include:   Gonorrhea.   Chlamydia.   Syphilis.   HIV and AIDS.   Genital herpes.   Hepatitis B and C.   Trichomonas.   Human papillomavirus (HPV).   Pubic lice.   Scabies.  Mites.  Bacterial vaginosis. WHAT ARE CAUSES OF STDs? An STD may be caused by bacteria, a virus, or parasites. STDs are often transmitted during sexual activity if one person is infected. However, they may also be transmitted through nonsexual means. STDs may be transmitted after:   Sexual intercourse with an infected person.   Sharing sex toys with an infected person.   Sharing needles with an infected person or using unclean piercing or tattoo needles.  Having intimate contact with the genitals, mouth, or rectal areas of an infected person.   Exposure to infected fluids during birth. WHAT ARE THE SIGNS AND SYMPTOMS OF STDs? Different STDs have different symptoms. Some people may not have any symptoms. If symptoms are present, they may include:   Painful or bloody urination.   Pain in the pelvis, abdomen, vagina, anus, throat, or eyes.   A skin rash, itching, or irritation.  Growths, ulcerations, blisters, or sores in the genital and anal areas.  Abnormal vaginal discharge with or without bad odor.   Penile discharge in men.   Fever.   Pain or bleeding during sexual intercourse.   Swollen glands in the groin area.   Yellow skin and eyes (jaundice). This is seen with hepatitis.   Swollen testicles.  Infertility.  Sores and blisters in the mouth. HOW ARE  STDs DIAGNOSED? To make a diagnosis, your health care provider may:   Take a medical history.   Perform a physical exam.   Take a sample of any discharge to examine.  Swab the throat, cervix, opening to the penis, rectum, or vagina for testing.  Test a sample of your first morning urine.   Perform blood tests.   Perform a Pap test, if this applies.   Perform a colposcopy.   Perform a laparoscopy.  HOW ARE STDs TREATED? Treatment depends on the STD. Some STDs may be treated but not cured.   Chlamydia, gonorrhea, trichomonas, and syphilis can be cured with antibiotic medicine.   Genital herpes, hepatitis, and HIV can be treated, but not cured, with prescribed medicines. The medicines lessen symptoms.   Genital warts from HPV can be treated with medicine or by freezing, burning (electrocautery), or surgery. Warts may come back.   HPV cannot be cured with medicine or surgery. However, abnormal areas may be removed from the cervix, vagina, or vulva.   If your diagnosis is confirmed, your recent sexual partners need treatment. This is true even if they are symptom-free or have a negative culture or evaluation. They should not have sex until their health care providers say it is okay. HOW CAN I REDUCE MY RISK OF GETTING AN STD? Take these steps to reduce your risk of getting an STD:  Use latex condoms, dental dams, and water-soluble lubricants during sexual activity. Do not use petroleum jelly or oils.  Avoid having multiple sex partners.  Do not have sex with someone who has other sex partners.  Do not have sex with anyone you do not know or who is at high risk for an STD.  Avoid risky sex practices that can break your skin.  Do not have sex if you have open sores on your mouth or skin.  Avoid drinking too much alcohol or taking illegal drugs. Alcohol and drugs can affect your judgment and put you in a vulnerable position.  Avoid engaging in oral and anal sex  acts.  Get vaccinated for HPV and hepatitis. If you have not received these vaccines in the past, talk to your health care provider about whether one or both might be right for you.   If you are at risk of being infected with HIV, it is recommended that you take a prescription medicine daily to prevent HIV infection. This is called pre-exposure prophylaxis (PrEP). You are considered at risk if:  You are a man who has sex with other men (MSM).  You are a heterosexual man or woman and are sexually active with more than one partner.  You take drugs by injection.  You are sexually active with a partner who has HIV.  Talk with your health care provider about whether you are at high risk of being infected with HIV. If you choose to begin PrEP, you should first be tested for HIV. You should then be tested every 3 months for as long as you are taking PrEP.  WHAT SHOULD I DO IF I THINK I HAVE AN STD?  See your health care provider.   Tell your sexual partner(s). They should be tested and treated for any STDs.  Do not have sex until your health care provider says it is okay. WHEN SHOULD I GET IMMEDIATE MEDICAL CARE? Contact your health care provider right away if:   You have severe abdominal pain.  You are a man and notice swelling or pain in your testicles.  You are a woman and notice swelling or pain in your vagina. Document Released: 04/03/2002 Document Revised: 01/16/2013 Document Reviewed: 08/01/2012 [facility] Patient Information 2015 [place], [place]. This information is not intended to replace advice given to you by your health care provider. Make sure you discuss any questions you have with your health care provider.  Safe Sex Safe sex is about reducing the risk of giving or getting a sexually transmitted disease (STD). STDs are spread through sexual contact involving the genitals, mouth, or rectum. Some STDs can be cured and others cannot. Safe sex can also prevent unintended  pregnancies.  WHAT ARE SOME SAFE SEX PRACTICES?  Limit your sexual activity to only one partner who is having sex with only you.  Talk to your partner about his or her past partners, past STDs, and drug use.  Use a condom every time you have sexual intercourse. This includes vaginal, oral, and anal sexual activity. Both females and males should wear condoms during oral sex. Only use latex or polyurethane condoms and water-based lubricants. Using petroleum-based lubricants or oils to lubricate a condom will weaken the condom and increase the chance that it will break. The condom should be in place from the beginning to the end of sexual activity. Wearing a condom reduces, but does not completely eliminate, your risk of getting or giving an STD. STDs can be spread by contact with infected body fluids and skin.  Get vaccinated for hepatitis B and HPV.  Avoid alcohol and recreational drugs, which can affect your  judgment. You may forget to use a condom or participate in high-risk sex.  For females, avoid douching after sexual intercourse. Douching can spread an infection farther into the reproductive tract.  Check your body for signs of sores, blisters, rashes, or unusual discharge. See your health care provider if you notice any of these signs.  Avoid sexual contact if you have symptoms of an infection or are being treated for an STD. If you or your partner has herpes, avoid sexual contact when blisters are present. Use condoms at all other times.  If you are at risk of being infected with HIV, it is recommended that you take a prescription medicine daily to prevent HIV infection. This is called pre-exposure prophylaxis (PrEP). You are considered at risk if:  You are a man who has sex with other men (MSM).  You are a heterosexual man or woman who is sexually active with more than one partner.  You take drugs by injection.  You are sexually active with a partner who has HIV.  Talk with your  health care provider about whether you are at high risk of being infected with HIV. If you choose to begin PrEP, you should first be tested for HIV. You should then be tested every 3 months for as long as you are taking PrEP.  See your health care provider for regular screenings, exams, and tests for other STDs. Before having sex with a new partner, each of you should be screened for STDs and should talk about the results with each other. WHAT ARE THE BENEFITS OF SAFE SEX?   There is less chance of getting or giving an STD.  You can prevent unwanted or unintended pregnancies.  By discussing safe sex concerns with your partner, you may increase feelings of intimacy, comfort, trust, and honesty between the two of you. Document Released: 02/19/2004 Document Revised: 05/28/2013 Document Reviewed: 07/05/2011 [facility] Patient Information 2015 [place], [place]. This information is not intended to replace advice given to you by your health care provider. Make sure you discuss any questions you have with your health care provider.

## 2014-04-15 LAB — GC/CHLAMYDIA PROBE AMP (~~LOC~~) NOT AT ARMC
Chlamydia: NEGATIVE
Neisseria Gonorrhea: NEGATIVE

## 2014-04-15 LAB — RPR: RPR Ser Ql: NONREACTIVE

## 2015-03-18 ENCOUNTER — Emergency Department (HOSPITAL_COMMUNITY): Payer: Self-pay

## 2015-03-18 ENCOUNTER — Emergency Department (HOSPITAL_COMMUNITY)
Admission: EM | Admit: 2015-03-18 | Discharge: 2015-03-18 | Disposition: A | Discharge status: Home / Self Care | Payer: Self-pay | Attending: Emergency Medicine | Admitting: Emergency Medicine

## 2015-03-18 ENCOUNTER — Encounter (HOSPITAL_COMMUNITY): Payer: Self-pay | Admitting: *Deleted

## 2015-03-18 DIAGNOSIS — J069 Acute upper respiratory infection, unspecified: Secondary | ICD-10-CM

## 2015-03-18 DIAGNOSIS — R062 Wheezing: Secondary | ICD-10-CM

## 2015-03-18 DIAGNOSIS — F17210 Nicotine dependence, cigarettes, uncomplicated: Secondary | ICD-10-CM | POA: Insufficient documentation

## 2015-03-18 DIAGNOSIS — Z87828 Personal history of other (healed) physical injury and trauma: Secondary | ICD-10-CM | POA: Insufficient documentation

## 2015-03-18 DIAGNOSIS — Z79899 Other long term (current) drug therapy: Secondary | ICD-10-CM | POA: Insufficient documentation

## 2015-03-18 MED ORDER — PREDNISONE 50 MG PO TABS: 50.0000 mg | ORAL_TABLET | Freq: Every day | ORAL | Status: DC

## 2015-03-18 MED ORDER — ALBUTEROL SULFATE HFA 108 (90 BASE) MCG/ACT IN AERS
1.0000 | INHALATION_SPRAY | Freq: Once | RESPIRATORY_TRACT | Status: AC
  Administered 2015-03-18: 1 via RESPIRATORY_TRACT
  Filled 2015-03-18: qty 6.7

## 2015-03-18 MED ORDER — IPRATROPIUM-ALBUTEROL 0.5-2.5 (3) MG/3ML IN SOLN
3.0000 mL | Freq: Once | RESPIRATORY_TRACT | Status: AC
  Administered 2015-03-18: 3 mL via RESPIRATORY_TRACT
  Filled 2015-03-18: qty 3

## 2015-03-18 NOTE — ED Notes (Signed)
Pt states that he has had a cough, congestion and flu symptoms for a week. Pt states that he continues to have cough that is not improving. Pt reports taking OTC medications with no relief.

## 2015-03-18 NOTE — ED Notes (Signed)
Brought patient back to room; patient getting undressed and into a gown at this time 
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
## 2015-03-18 NOTE — Discharge Instructions (Signed)
Upper Respiratory Infection, Adult Most upper respiratory infections (URIs) are caused by a virus. A URI affects the nose, throat, and upper air passages. The most common type of URI is often called "the common cold." HOME CARE   Take medicines only as told by your doctor.  Gargle warm saltwater or take cough drops to comfort your throat as told by your doctor.  Use a warm mist humidifier or inhale steam from a shower to increase air moisture. This may make it easier to breathe.  Drink enough fluid to keep your pee (urine) clear or pale yellow.  Eat soups and other clear broths.  Have a healthy diet.  Rest as needed.  Go back to work when your fever is gone or your doctor says it is okay.  You may need to stay home longer to avoid giving your URI to others.  You can also wear a face mask and wash your hands often to prevent spread of the virus.  Use your inhaler more if you have asthma.  Do not use any tobacco products, including cigarettes, chewing tobacco, or electronic cigarettes. If you need help quitting, ask your doctor. GET HELP IF:  You are getting worse, not better.  Your symptoms are not helped by medicine.  You have chills.  You are getting more short of breath.  You have brown or red mucus.  You have yellow or brown discharge from your nose.  You have pain in your face, especially when you bend forward.  You have a fever.  You have puffy (swollen) neck glands.  You have pain while swallowing.  You have white areas in the back of your throat. GET HELP RIGHT AWAY IF:   You have very bad or constant:  Headache.  Ear pain.  Pain in your forehead, behind your eyes, and over your cheekbones (sinus pain).  Chest pain.  You have long-lasting (chronic) lung disease and any of the following:  Wheezing.  Long-lasting cough.  Coughing up blood.  A change in your usual mucus.  You have a stiff neck.  You have changes in  your:  Vision.  Hearing.  Thinking.  Mood. MAKE SURE YOU:   Understand these instructions.  Will watch your condition.  Will get help right away if you are not doing well or get worse.   This information is not intended to replace advice given to you by your health care provider. Make sure you discuss any questions you have with your health care provider.  Follow up with primary care provider if your symptoms do not improve. Encourage smoking cessation. Take steroids as prescribed. Use albuterol inhaler as needed. Return to the ED if you experience severe worsening of your symptoms, fever, chills, difficulty breathing, chest pain, dizziness, loss of consciousness.

## 2016-01-03 ENCOUNTER — Encounter (HOSPITAL_COMMUNITY): Payer: Self-pay

## 2016-01-03 ENCOUNTER — Emergency Department (HOSPITAL_COMMUNITY)
Admission: EM | Admit: 2016-01-03 | Discharge: 2016-01-03 | Disposition: A | Discharge status: Home / Self Care | Payer: Self-pay | Attending: Emergency Medicine | Admitting: Emergency Medicine

## 2016-01-03 ENCOUNTER — Emergency Department (HOSPITAL_COMMUNITY): Payer: Self-pay

## 2016-01-03 DIAGNOSIS — F17210 Nicotine dependence, cigarettes, uncomplicated: Secondary | ICD-10-CM | POA: Insufficient documentation

## 2016-01-03 DIAGNOSIS — J181 Lobar pneumonia, unspecified organism: Secondary | ICD-10-CM | POA: Insufficient documentation

## 2016-01-03 DIAGNOSIS — J189 Pneumonia, unspecified organism: Secondary | ICD-10-CM

## 2016-01-03 DIAGNOSIS — Z79899 Other long term (current) drug therapy: Secondary | ICD-10-CM | POA: Insufficient documentation

## 2016-01-03 MED ORDER — IPRATROPIUM-ALBUTEROL 0.5-2.5 (3) MG/3ML IN SOLN
3.0000 mL | Freq: Once | RESPIRATORY_TRACT | Status: AC
  Administered 2016-01-03: 3 mL via RESPIRATORY_TRACT
  Filled 2016-01-03: qty 3

## 2016-01-03 MED ORDER — LEVOFLOXACIN 500 MG PO TABS: 500.0000 mg | ORAL_TABLET | Freq: Every day | ORAL | 0 refills | Status: DC

## 2016-01-03 MED ORDER — LEVOFLOXACIN 750 MG PO TABS
750.0000 mg | ORAL_TABLET | Freq: Once | ORAL | Status: AC
  Administered 2016-01-03: 750 mg via ORAL
  Filled 2016-01-03: qty 1

## 2016-01-03 MED ORDER — ALBUTEROL SULFATE HFA 108 (90 BASE) MCG/ACT IN AERS
2.0000 | INHALATION_SPRAY | RESPIRATORY_TRACT | Status: DC | PRN
  Administered 2016-01-03: 2 via RESPIRATORY_TRACT
  Filled 2016-01-03: qty 6.7

## 2016-01-03 MED ORDER — PREDNISONE 10 MG PO TABS: 20.0000 mg | ORAL_TABLET | Freq: Two times a day (BID) | ORAL | 0 refills | Status: DC

## 2016-01-03 MED ORDER — BENZONATATE 200 MG PO CAPS: 200.0000 mg | ORAL_CAPSULE | Freq: Three times a day (TID) | ORAL | 0 refills | Status: DC | PRN

## 2016-01-03 NOTE — ED Triage Notes (Signed)
Pt. Has a cough for over 9 day. Congested and chills Pt. also has back pain from coughing
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
## 2016-01-03 NOTE — ED Notes (Signed)
Declined W/C at D/C and was escorted to lobby by RN. 

## 2016-01-04 ENCOUNTER — Telehealth: Payer: Self-pay | Admitting: *Deleted

## 2016-01-04 NOTE — Telephone Encounter (Signed)
CM able to Text GoodRx coupon for Levaquin which reduced the cost of this Rx to $13.15. No further CM needs at this time.

## 2016-10-06 ENCOUNTER — Encounter (HOSPITAL_COMMUNITY): Payer: Self-pay

## 2016-10-06 ENCOUNTER — Emergency Department (HOSPITAL_COMMUNITY)
Admission: EM | Admit: 2016-10-06 | Discharge: 2016-10-06 | Disposition: A | Discharge status: Home / Self Care | Payer: Self-pay | Attending: Emergency Medicine | Admitting: Emergency Medicine

## 2016-10-06 ENCOUNTER — Emergency Department (HOSPITAL_COMMUNITY): Payer: Self-pay

## 2016-10-06 DIAGNOSIS — Z79899 Other long term (current) drug therapy: Secondary | ICD-10-CM | POA: Insufficient documentation

## 2016-10-06 DIAGNOSIS — S82201B Unspecified fracture of shaft of right tibia, initial encounter for open fracture type I or II: Secondary | ICD-10-CM | POA: Insufficient documentation

## 2016-10-06 DIAGNOSIS — Y9389 Activity, other specified: Secondary | ICD-10-CM | POA: Insufficient documentation

## 2016-10-06 DIAGNOSIS — Y929 Unspecified place or not applicable: Secondary | ICD-10-CM | POA: Insufficient documentation

## 2016-10-06 DIAGNOSIS — W25XXXA Contact with sharp glass, initial encounter: Secondary | ICD-10-CM | POA: Insufficient documentation

## 2016-10-06 DIAGNOSIS — S81811A Laceration without foreign body, right lower leg, initial encounter: Secondary | ICD-10-CM | POA: Insufficient documentation

## 2016-10-06 DIAGNOSIS — F17210 Nicotine dependence, cigarettes, uncomplicated: Secondary | ICD-10-CM | POA: Insufficient documentation

## 2016-10-06 DIAGNOSIS — Y999 Unspecified external cause status: Secondary | ICD-10-CM | POA: Insufficient documentation

## 2016-10-06 MED ORDER — TETANUS-DIPHTH-ACELL PERTUSSIS 5-2.5-18.5 LF-MCG/0.5 IM SUSP
0.5000 mL | Freq: Once | INTRAMUSCULAR | Status: AC
  Administered 2016-10-06: 0.5 mL via INTRAMUSCULAR
  Filled 2016-10-06: qty 0.5

## 2016-10-06 MED ORDER — LIDOCAINE-EPINEPHRINE (PF) 2 %-1:200000 IJ SOLN
20.0000 mL | Freq: Once | INTRAMUSCULAR | Status: AC
  Administered 2016-10-06: 20 mL via INTRADERMAL
  Filled 2016-10-06: qty 20

## 2016-10-06 MED ORDER — MELOXICAM 15 MG PO TABS: 15.0000 mg | ORAL_TABLET | Freq: Every day | ORAL | 0 refills | Status: DC

## 2016-10-06 MED ORDER — OXYCODONE-ACETAMINOPHEN 5-325 MG PO TABS
1.0000 | ORAL_TABLET | ORAL | Status: DC | PRN
  Administered 2016-10-06: 1 via ORAL

## 2016-10-06 MED ORDER — OXYCODONE-ACETAMINOPHEN 5-325 MG PO TABS
ORAL_TABLET | ORAL | Status: AC
  Filled 2016-10-06: qty 1

## 2016-10-06 MED ORDER — CEPHALEXIN 500 MG PO CAPS: 500.0000 mg | ORAL_CAPSULE | Freq: Three times a day (TID) | ORAL | 0 refills | Status: DC

## 2016-10-06 MED ORDER — CEFAZOLIN SODIUM-DEXTROSE 2-4 GM/100ML-% IV SOLN
2.0000 g | Freq: Once | INTRAVENOUS | Status: AC
  Administered 2016-10-06: 2 g via INTRAVENOUS
  Filled 2016-10-06: qty 100

## 2016-10-06 NOTE — Discharge Instructions (Signed)
WOUND CARE ?Please have your stitches/staples removed in 10 or sooner if you have concerns. You may do this at any available urgent care or at your primary care doctor's office. ? Keep area clean and dry for 24 hours. Do not remove ?bandage, if applied. ? After 24 hours, remove bandage and wash wound ?gently with mild soap and warm water. Reapply ?a new bandage after cleaning wound, if directed. ? Continue daily cleansing with soap and water until ?stitches/staples are removed. ? Do not apply any ointments or creams to the wound ?while stitches/staples are in place, as this may cause ?delayed healing. ? Seek medical careif you experience any of the following ?signs of infection: Swelling, redness, pus drainage, ?streaking, fever >101.0 F ? Seek care if you experience excessive bleeding ?that does not stop after 15-20 minutes of constant, firm ?pressure. ? ? ?
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
## 2016-10-06 NOTE — ED Triage Notes (Signed)
Per P, Pt reports running into broken glass. Has a two inch x 1 inch laceration noted to the right lower leg with some active bleeding.

## 2016-10-27 ENCOUNTER — Emergency Department (HOSPITAL_COMMUNITY)
Admission: EM | Admit: 2016-10-27 | Discharge: 2016-10-27 | Disposition: A | Discharge status: Home / Self Care | Payer: Self-pay | Attending: Emergency Medicine | Admitting: Emergency Medicine

## 2016-10-27 ENCOUNTER — Encounter (HOSPITAL_COMMUNITY): Payer: Self-pay | Admitting: *Deleted

## 2016-10-27 DIAGNOSIS — Z5321 Procedure and treatment not carried out due to patient leaving prior to being seen by health care provider: Secondary | ICD-10-CM | POA: Insufficient documentation

## 2016-10-27 DIAGNOSIS — Z4802 Encounter for removal of sutures: Secondary | ICD-10-CM | POA: Insufficient documentation

## 2016-10-27 DIAGNOSIS — X58XXXD Exposure to other specified factors, subsequent encounter: Secondary | ICD-10-CM | POA: Insufficient documentation

## 2016-10-27 DIAGNOSIS — S81819D Laceration without foreign body, unspecified lower leg, subsequent encounter: Secondary | ICD-10-CM | POA: Insufficient documentation

## 2016-10-27 NOTE — ED Notes (Signed)
Called no answer

## 2016-10-27 NOTE — ED Notes (Signed)
Pt requesting estimated wait time, pt updated and encouraged to stay to be seen by MD, pt walked out cursing, will attempt to call pt when room is available

## 2016-10-27 NOTE — ED Triage Notes (Signed)
To ED for staple removal of shin. Pt states he tried to take staples out himself but some cut and some he wasn't able to get out. Area is red but pt denies drainage. States he was put on abx when placed.

## 2016-11-04 ENCOUNTER — Emergency Department (HOSPITAL_COMMUNITY)
Admission: EM | Admit: 2016-11-04 | Discharge: 2016-11-04 | Disposition: A | Discharge status: Home / Self Care | Payer: Self-pay | Attending: Emergency Medicine | Admitting: Emergency Medicine

## 2016-11-04 ENCOUNTER — Encounter (HOSPITAL_COMMUNITY): Payer: Self-pay

## 2016-11-04 DIAGNOSIS — F17210 Nicotine dependence, cigarettes, uncomplicated: Secondary | ICD-10-CM | POA: Insufficient documentation

## 2016-11-04 DIAGNOSIS — Z79899 Other long term (current) drug therapy: Secondary | ICD-10-CM | POA: Insufficient documentation

## 2016-11-04 DIAGNOSIS — Z4802 Encounter for removal of sutures: Secondary | ICD-10-CM | POA: Insufficient documentation

## 2016-11-04 DIAGNOSIS — S81811D Laceration without foreign body, right lower leg, subsequent encounter: Secondary | ICD-10-CM | POA: Insufficient documentation

## 2016-11-04 DIAGNOSIS — X58XXXD Exposure to other specified factors, subsequent encounter: Secondary | ICD-10-CM | POA: Insufficient documentation

## 2016-11-04 NOTE — ED Notes (Signed)
Applied bacitracin to shin.

## 2016-11-04 NOTE — ED Triage Notes (Signed)
Pt. Has staples to be removed rt. Shin area.  No redness noted or drainage noted.  Well approximated.  They are 2 weeks overdue.

## 2016-11-04 NOTE — Discharge Instructions (Signed)
Keep your wound clean. You can apply Neosporin. Return to the ER for fever, purulent drainage or other concerns.
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
## 2018-01-21 ENCOUNTER — Encounter (HOSPITAL_COMMUNITY): Payer: Self-pay

## 2018-01-21 ENCOUNTER — Emergency Department (HOSPITAL_COMMUNITY): Payer: Self-pay

## 2018-01-21 ENCOUNTER — Emergency Department (HOSPITAL_COMMUNITY)
Admission: EM | Admit: 2018-01-21 | Discharge: 2018-01-21 | Disposition: A | Discharge status: Home / Self Care | Payer: Self-pay | Attending: Emergency Medicine | Admitting: Emergency Medicine

## 2018-01-21 DIAGNOSIS — F17210 Nicotine dependence, cigarettes, uncomplicated: Secondary | ICD-10-CM | POA: Insufficient documentation

## 2018-01-21 DIAGNOSIS — Z79899 Other long term (current) drug therapy: Secondary | ICD-10-CM | POA: Insufficient documentation

## 2018-01-21 DIAGNOSIS — J40 Bronchitis, not specified as acute or chronic: Secondary | ICD-10-CM

## 2018-01-21 DIAGNOSIS — J209 Acute bronchitis, unspecified: Secondary | ICD-10-CM | POA: Insufficient documentation

## 2018-01-21 MED ORDER — AZITHROMYCIN 250 MG PO TABS: 250.0000 mg | ORAL_TABLET | Freq: Every day | ORAL | 0 refills | Status: DC

## 2018-01-21 NOTE — ED Notes (Signed)
Patient verbalizes understanding of discharge instructions. Opportunity for questioning and answers were provided. Armband removed by staff, pt discharged from ED ambulatory.   
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
## 2018-01-21 NOTE — ED Triage Notes (Signed)
Patient complains of 1 week of cough with fever. States that the fever has now resolved but ongoing cough and back pain, NAD

## 2018-01-21 NOTE — ED Notes (Signed)
Patient transported to X-ray 

## 2018-04-27 ENCOUNTER — Encounter (HOSPITAL_COMMUNITY): Payer: Self-pay | Admitting: *Deleted

## 2018-04-27 ENCOUNTER — Other Ambulatory Visit: Payer: Self-pay

## 2018-04-27 ENCOUNTER — Emergency Department (HOSPITAL_COMMUNITY)
Admission: EM | Admit: 2018-04-27 | Discharge: 2018-04-27 | Disposition: A | Discharge status: Home / Self Care | Payer: Self-pay | Attending: Emergency Medicine | Admitting: Emergency Medicine

## 2018-04-27 DIAGNOSIS — J3489 Other specified disorders of nose and nasal sinuses: Secondary | ICD-10-CM | POA: Insufficient documentation

## 2018-04-27 DIAGNOSIS — F17210 Nicotine dependence, cigarettes, uncomplicated: Secondary | ICD-10-CM | POA: Insufficient documentation

## 2018-04-27 MED ORDER — CETIRIZINE-PSEUDOEPHEDRINE ER 5-120 MG PO TB12
1.0000 | ORAL_TABLET | Freq: Every day | ORAL | 0 refills | Status: DC
Start: 2018-04-27 — End: 2018-08-02

## 2018-04-27 MED ORDER — FLUTICASONE PROPIONATE 50 MCG/ACT NA SUSP: 1.0000 | Freq: Every day | NASAL | 2 refills | Status: DC
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
## 2018-04-27 NOTE — Discharge Instructions (Addendum)
You were evaluated today for nasal congestion.  I would suggest you start taking Zyrtec, a medication for seasonal allergies.  I have written a prescription for this. I have also given you a nasal spray to help with your nasal discharge. You may also use a Nettie pot which you can obtain over-the-counter.  I suggest following up with a primary care provider for continued of symptoms.  If you develop chest pain, shortness of breath, coughing up blood, high fever please return to the emergency department for reevaluation.

## 2018-04-27 NOTE — ED Triage Notes (Signed)
PT reports he sneezed once last night with  Mucous . Pt also reports a cough.

## 2018-08-02 ENCOUNTER — Emergency Department (HOSPITAL_COMMUNITY): Payer: No Typology Code available for payment source

## 2018-08-02 ENCOUNTER — Emergency Department (HOSPITAL_COMMUNITY)
Admission: EM | Admit: 2018-08-02 | Discharge: 2018-08-02 | Disposition: A | Discharge status: Home / Self Care | Payer: No Typology Code available for payment source | Attending: Emergency Medicine | Admitting: Emergency Medicine

## 2018-08-02 DIAGNOSIS — Y939 Activity, unspecified: Secondary | ICD-10-CM | POA: Diagnosis not present

## 2018-08-02 DIAGNOSIS — T07XXXA Unspecified multiple injuries, initial encounter: Secondary | ICD-10-CM | POA: Diagnosis present

## 2018-08-02 DIAGNOSIS — Y929 Unspecified place or not applicable: Secondary | ICD-10-CM | POA: Diagnosis not present

## 2018-08-02 DIAGNOSIS — Y999 Unspecified external cause status: Secondary | ICD-10-CM | POA: Insufficient documentation

## 2018-08-02 DIAGNOSIS — M533 Sacrococcygeal disorders, not elsewhere classified: Secondary | ICD-10-CM | POA: Insufficient documentation

## 2018-08-02 DIAGNOSIS — F17210 Nicotine dependence, cigarettes, uncomplicated: Secondary | ICD-10-CM | POA: Insufficient documentation

## 2018-08-02 DIAGNOSIS — F1290 Cannabis use, unspecified, uncomplicated: Secondary | ICD-10-CM | POA: Diagnosis not present

## 2018-08-02 DIAGNOSIS — S7002XA Contusion of left hip, initial encounter: Secondary | ICD-10-CM | POA: Diagnosis not present

## 2018-08-02 MED ORDER — DICLOFENAC SODIUM 1 % TD GEL: 2.0000 g | Freq: Four times a day (QID) | TRANSDERMAL | 0 refills | Status: AC

## 2018-08-02 MED ORDER — CYCLOBENZAPRINE HCL 10 MG PO TABS: 10.0000 mg | ORAL_TABLET | Freq: Two times a day (BID) | ORAL | 0 refills | Status: AC | PRN

## 2018-08-02 MED FILL — CYCLOBENZAPRINE 10 MG TAB: 10 | 10 days supply | Qty: 20 | Fill #0

## 2018-08-02 MED FILL — DICLOFENAC SODIUM 1 % GEL: 1 | 13 days supply | Qty: 100 | Fill #0
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
## 2018-08-02 NOTE — ED Notes (Signed)
Patient verbalizes understanding of discharge instructions . Opportunity for questions and answers were provided . Armband removed by staff ,Pt discharged from ED. W/C  offered at D/C  and Declined W/C at D/C and was escorted to lobby by RN.  
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
## 2019-07-02 DIAGNOSIS — F1024 Alcohol dependence with alcohol-induced mood disorder: Secondary | ICD-10-CM | POA: Insufficient documentation

## 2019-07-02 DIAGNOSIS — F1410 Cocaine abuse, uncomplicated: Secondary | ICD-10-CM | POA: Insufficient documentation

## 2019-07-09 ENCOUNTER — Encounter (HOSPITAL_BASED_OUTPATIENT_CLINIC_OR_DEPARTMENT_OTHER): Payer: Self-pay | Admitting: Emergency Medicine

## 2019-07-09 ENCOUNTER — Other Ambulatory Visit: Payer: Self-pay

## 2019-07-09 ENCOUNTER — Emergency Department (HOSPITAL_BASED_OUTPATIENT_CLINIC_OR_DEPARTMENT_OTHER)
Admission: EM | Admit: 2019-07-09 | Discharge: 2019-07-09 | Disposition: A | Discharge status: Home / Self Care | Payer: Self-pay | Attending: Emergency Medicine | Admitting: Emergency Medicine

## 2019-07-09 DIAGNOSIS — K625 Hemorrhage of anus and rectum: Secondary | ICD-10-CM | POA: Insufficient documentation

## 2019-07-09 DIAGNOSIS — F17290 Nicotine dependence, other tobacco product, uncomplicated: Secondary | ICD-10-CM | POA: Insufficient documentation

## 2019-07-09 DIAGNOSIS — F17210 Nicotine dependence, cigarettes, uncomplicated: Secondary | ICD-10-CM | POA: Insufficient documentation

## 2019-07-09 MED ORDER — TRAZODONE HCL 150 MG PO TABS: 150.0000 mg | ORAL_TABLET | Freq: Every day | ORAL | 0 refills | Status: DC
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
## 2019-11-02 ENCOUNTER — Other Ambulatory Visit: Payer: Self-pay

## 2019-11-02 ENCOUNTER — Emergency Department (HOSPITAL_BASED_OUTPATIENT_CLINIC_OR_DEPARTMENT_OTHER)
Admission: EM | Admit: 2019-11-02 | Discharge: 2019-11-02 | Disposition: A | Discharge status: Home / Self Care | Payer: Self-pay | Attending: Emergency Medicine | Admitting: Emergency Medicine

## 2019-11-02 ENCOUNTER — Encounter (HOSPITAL_BASED_OUTPATIENT_CLINIC_OR_DEPARTMENT_OTHER): Payer: Self-pay | Admitting: *Deleted

## 2019-11-02 ENCOUNTER — Other Ambulatory Visit (HOSPITAL_BASED_OUTPATIENT_CLINIC_OR_DEPARTMENT_OTHER): Payer: Self-pay | Admitting: Emergency Medicine

## 2019-11-02 DIAGNOSIS — F1910 Other psychoactive substance abuse, uncomplicated: Secondary | ICD-10-CM | POA: Insufficient documentation

## 2019-11-02 DIAGNOSIS — F17290 Nicotine dependence, other tobacco product, uncomplicated: Secondary | ICD-10-CM | POA: Insufficient documentation

## 2019-11-02 HISTORY — DX: Emphysema, unspecified: J43.9

## 2019-11-02 MED ORDER — TRAZODONE HCL 150 MG PO TABS: 150.0000 mg | ORAL_TABLET | Freq: Every day | ORAL | 0 refills | Status: DC

## 2019-11-02 MED ORDER — FLUOXETINE HCL 20 MG PO TABS: 20.0000 mg | ORAL_TABLET | Freq: Every day | ORAL | 0 refills | Status: DC

## 2019-11-02 MED FILL — traZODone HCL 150 MG TABS: 150 | 30 days supply | Qty: 30 | Fill #0

## 2019-11-02 MED FILL — FLUoxetine HCL 20 MG CAPS: 20 | 30 days supply | Qty: 30 | Fill #0
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
## 2019-11-02 NOTE — ED Triage Notes (Signed)
States he was attempting to be admitted to [facility] today. They told him he would need medical clearance to show that he no longer needs prozac and trazadone (which he has not been on for 3 months) or he needs refills in order to be admitted. Denies SI/HI. 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 Last drank yesterday. Last used cocaine 3 days ago.

## 2019-11-02 NOTE — Discharge Instructions (Signed)
Please continue your outpatient management with [name] for further management of your polysubstance abuse.  We are able to fill a prescription for your home Prozac and trazodone as well as printed prescription for you for both.  Please use these medications to help in your management.  Please rest and stay hydrated.  If any symptoms change or worsen, please return to the nearest emergency department.

## 2019-11-30 ENCOUNTER — Telehealth (INDEPENDENT_AMBULATORY_CARE_PROVIDER_SITE_OTHER): Payer: No Payment, Other | Admitting: Physician Assistant

## 2019-11-30 ENCOUNTER — Other Ambulatory Visit: Payer: Self-pay

## 2019-11-30 DIAGNOSIS — G4700 Insomnia, unspecified: Secondary | ICD-10-CM | POA: Diagnosis not present

## 2019-11-30 DIAGNOSIS — F330 Major depressive disorder, recurrent, mild: Secondary | ICD-10-CM

## 2019-11-30 MED ORDER — TRAZODONE HCL 150 MG PO TABS: 150.0000 mg | ORAL_TABLET | Freq: Every day | ORAL | 2 refills | Status: DC

## 2019-11-30 MED ORDER — FLUOXETINE HCL 20 MG PO CAPS: 20.0000 mg | ORAL_CAPSULE | Freq: Every day | ORAL | 2 refills | Status: DC

## 2019-12-02 ENCOUNTER — Other Ambulatory Visit: Payer: Self-pay

## 2019-12-02 ENCOUNTER — Emergency Department (HOSPITAL_BASED_OUTPATIENT_CLINIC_OR_DEPARTMENT_OTHER): Payer: Self-pay

## 2019-12-02 ENCOUNTER — Emergency Department (HOSPITAL_BASED_OUTPATIENT_CLINIC_OR_DEPARTMENT_OTHER)
Admission: EM | Admit: 2019-12-02 | Discharge: 2019-12-02 | Disposition: A | Discharge status: Home / Self Care | Payer: Self-pay | Attending: Emergency Medicine | Admitting: Emergency Medicine

## 2019-12-02 ENCOUNTER — Encounter (HOSPITAL_BASED_OUTPATIENT_CLINIC_OR_DEPARTMENT_OTHER): Payer: Self-pay | Admitting: Emergency Medicine

## 2019-12-02 DIAGNOSIS — W2106XA Struck by volleyball, initial encounter: Secondary | ICD-10-CM | POA: Insufficient documentation

## 2019-12-02 DIAGNOSIS — F17210 Nicotine dependence, cigarettes, uncomplicated: Secondary | ICD-10-CM | POA: Insufficient documentation

## 2019-12-02 DIAGNOSIS — S6991XA Unspecified injury of right wrist, hand and finger(s), initial encounter: Secondary | ICD-10-CM | POA: Insufficient documentation
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
## 2019-12-02 NOTE — Discharge Instructions (Signed)
Your x-ray showed a possible chip fracture of your middle finger.  This should heal with time and supportive treatment.  Use the finger splint as needed for pain. Use Tylenol and ibuprofen help with pain. Use ice to help with pain and swelling. You likely have continued swelling to the neck several weeks. This should be treated like a jammed finger, there is information about this in the paperwork. Return to the emergency room if develop severe worsening pain, numbness, color change of the end of your finger, any new, worsening, or concerning symptoms.

## 2019-12-02 NOTE — ED Triage Notes (Signed)
Reports jamming middle right finger yesterday playing volleyball.  Now having swelling.

## 2020-01-11 ENCOUNTER — Other Ambulatory Visit: Payer: Self-pay

## 2020-01-11 ENCOUNTER — Telehealth (HOSPITAL_COMMUNITY): Payer: No Payment, Other | Admitting: Physician Assistant

## 2020-01-21 ENCOUNTER — Encounter (HOSPITAL_COMMUNITY): Payer: Self-pay | Admitting: Physician Assistant
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
## 2020-02-08 ENCOUNTER — Encounter (HOSPITAL_COMMUNITY): Payer: Self-pay | Admitting: Emergency Medicine

## 2020-02-08 ENCOUNTER — Other Ambulatory Visit: Payer: Self-pay

## 2020-02-08 ENCOUNTER — Emergency Department (HOSPITAL_COMMUNITY)
Admission: EM | Admit: 2020-02-08 | Discharge: 2020-02-08 | Disposition: A | Discharge status: Home / Self Care | Payer: Self-pay | Attending: Emergency Medicine | Admitting: Emergency Medicine

## 2020-02-08 ENCOUNTER — Emergency Department (HOSPITAL_BASED_OUTPATIENT_CLINIC_OR_DEPARTMENT_OTHER): Payer: Self-pay

## 2020-02-08 DIAGNOSIS — M7989 Other specified soft tissue disorders: Secondary | ICD-10-CM

## 2020-02-08 DIAGNOSIS — M79605 Pain in left leg: Secondary | ICD-10-CM

## 2020-02-08 DIAGNOSIS — F17290 Nicotine dependence, other tobacco product, uncomplicated: Secondary | ICD-10-CM | POA: Insufficient documentation

## 2020-02-08 DIAGNOSIS — Z96652 Presence of left artificial knee joint: Secondary | ICD-10-CM | POA: Insufficient documentation

## 2020-02-08 DIAGNOSIS — F17210 Nicotine dependence, cigarettes, uncomplicated: Secondary | ICD-10-CM | POA: Insufficient documentation

## 2020-02-08 LAB — BASIC METABOLIC PANEL
Anion gap: 10 (ref 5–15)
BUN: 10 mg/dL (ref 6–20)
CO2: 25 mmol/L (ref 22–32)
Calcium: 9.2 mg/dL (ref 8.9–10.3)
Chloride: 102 mmol/L (ref 98–111)
Creatinine, Ser: 1.12 mg/dL (ref 0.61–1.24)
GFR, Estimated: >60 mL/min (ref >60)
Glucose, Bld: 92 mg/dL (ref 70–99)
Potassium: 3.9 mmol/L (ref 3.5–5.1)
Sodium: 137 mmol/L (ref 135–145)

## 2020-02-08 LAB — CBC WITH DIFFERENTIAL/PLATELET
Abs Immature Granulocytes: 0.05 10*3/uL (ref 0.00–0.07)
Basophils Absolute: 0.1 10*3/uL (ref 0.0–0.1)
Basophils Relative: 1 %
Eosinophils Absolute: 0.2 10*3/uL (ref 0.0–0.5)
Eosinophils Relative: 2 %
HCT: 47.7 % (ref 39.0–52.0)
Hemoglobin: 15.8 g/dL (ref 13.0–17.0)
Immature Granulocytes: 1 %
Lymphocytes Relative: 33 %
Lymphs Abs: 2.7 10*3/uL (ref 0.7–4.0)
MCH: 32.6 pg (ref 26.0–34.0)
MCHC: 33.1 g/dL (ref 30.0–36.0)
MCV: 98.6 fL (ref 80.0–100.0)
Monocytes Absolute: 0.7 10*3/uL (ref 0.1–1.0)
Monocytes Relative: 8 %
Neutro Abs: 4.5 10*3/uL (ref 1.7–7.7)
Neutrophils Relative %: 55 %
Platelets: 285 10*3/uL (ref 150–400)
RBC: 4.84 MIL/uL (ref 4.22–5.81)
RDW: 12.8 % (ref 11.5–15.5)
WBC: 8.2 10*3/uL (ref 4.0–10.5)
nRBC: 0 % (ref 0.0–0.2)

## 2020-02-08 MED ORDER — NAPROXEN 500 MG PO TABS: 500.0000 mg | ORAL_TABLET | Freq: Two times a day (BID) | ORAL | 0 refills | Status: DC

## 2020-02-08 NOTE — ED Triage Notes (Signed)
Patient coming from home. Compliant of left leg pain for 3-4 days. Patient states he does sit for long periods of time and is worried about blood clots. VSS. NAD.
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
## 2020-02-08 NOTE — Discharge Instructions (Addendum)
Take naproxen 2 times a day with meals.  Do not take other anti-inflammatories at the same time (Advil, Motrin, ibuprofen, Aleve). You may supplement with Tylenol if you need further pain control. Use ice packs or heating pads if this helps control your pain. Follow-up with the vascular doctor listed below. If you are unable to follow-up with them, you follow-up with [facility] with the wellness clinic to establish primary care and they may be able to help with referrals as needed. Return to the emergency room if you develop severe worsening pain, numbness in your foot, if your foot turns white and cold, or with any new, worsening, or concerning symptoms

## 2020-02-08 NOTE — Progress Notes (Signed)
Lower extremity venous has been completed.   Preliminary results in CV Proc.   Blanch Media 02/08/2020 1:43 PM

## 2020-04-23 ENCOUNTER — Emergency Department (HOSPITAL_COMMUNITY)
Admission: EM | Admit: 2020-04-23 | Discharge: 2020-04-23 | Disposition: A | Discharge status: Home / Self Care | Payer: Self-pay | Attending: Emergency Medicine | Admitting: Emergency Medicine

## 2020-04-23 ENCOUNTER — Emergency Department (HOSPITAL_COMMUNITY): Payer: Self-pay

## 2020-04-23 ENCOUNTER — Other Ambulatory Visit: Payer: Self-pay

## 2020-04-23 ENCOUNTER — Encounter (HOSPITAL_COMMUNITY): Payer: Self-pay

## 2020-04-23 DIAGNOSIS — F17210 Nicotine dependence, cigarettes, uncomplicated: Secondary | ICD-10-CM | POA: Insufficient documentation

## 2020-04-23 DIAGNOSIS — R1032 Left lower quadrant pain: Secondary | ICD-10-CM | POA: Insufficient documentation

## 2020-04-23 LAB — BASIC METABOLIC PANEL
Anion gap: 9 (ref 5–15)
BUN: 9 mg/dL (ref 6–20)
CO2: 28 mmol/L (ref 22–32)
Calcium: 9.5 mg/dL (ref 8.9–10.3)
Chloride: 101 mmol/L (ref 98–111)
Creatinine, Ser: 1.14 mg/dL (ref 0.61–1.24)
GFR, Estimated: >60 mL/min (ref >60)
Glucose, Bld: 103 mg/dL — ABNORMAL HIGH (ref 70–99)
Potassium: 4.7 mmol/L (ref 3.5–5.1)
Sodium: 138 mmol/L (ref 135–145)

## 2020-04-23 LAB — TROPONIN I (HIGH SENSITIVITY)
Troponin I (High Sensitivity): 7 ng/L (ref <18)
Troponin I (High Sensitivity): 7 ng/L (ref <18)

## 2020-04-23 LAB — CBC
HCT: 49.6 % (ref 39.0–52.0)
Hemoglobin: 16.9 g/dL (ref 13.0–17.0)
MCH: 33.2 pg (ref 26.0–34.0)
MCHC: 34.1 g/dL (ref 30.0–36.0)
MCV: 97.4 fL (ref 80.0–100.0)
Platelets: 280 10*3/uL (ref 150–400)
RBC: 5.09 MIL/uL (ref 4.22–5.81)
RDW: 12.4 % (ref 11.5–15.5)
WBC: 8.3 10*3/uL (ref 4.0–10.5)
nRBC: 0 % (ref 0.0–0.2)

## 2020-04-23 LAB — LIPASE, BLOOD: Lipase: 39 U/L (ref 11–51)

## 2020-04-23 MED ORDER — DICYCLOMINE HCL 20 MG PO TABS: 20.0000 mg | ORAL_TABLET | Freq: Two times a day (BID) | ORAL | 0 refills | Status: AC

## 2020-04-23 MED ORDER — IOHEXOL 300 MG/ML  SOLN
100.0000 mL | Freq: Once | INTRAMUSCULAR | Status: AC | PRN
  Administered 2020-04-23: 100 mL via INTRAVENOUS

## 2020-04-23 MED ORDER — DICYCLOMINE HCL 10 MG PO CAPS
10.0000 mg | ORAL_CAPSULE | Freq: Once | ORAL | Status: AC
  Administered 2020-04-23: 10 mg via ORAL
  Filled 2020-04-23: qty 1

## 2020-04-23 MED ORDER — DICYCLOMINE HCL 10 MG/ML IM SOLN
20.0000 mg | Freq: Once | INTRAMUSCULAR | Status: AC
  Administered 2020-04-23: 20 mg via INTRAMUSCULAR
  Filled 2020-04-23: qty 2

## 2020-04-23 NOTE — ED Triage Notes (Signed)
Patient complains of left sided chest and abdominal pain for 1 week. denies vomiting and reports stools a different color and consistency, light tan. Patient appears in NAD, drinks daily

## 2020-04-23 NOTE — ED Notes (Signed)
PT DENIES ANY NEEDS AT THIS TIME. NAD NOTED RESP EQUAL AND NON LABOR. REMAINS ON MONITOR

## 2020-04-23 NOTE — ED Notes (Signed)
Patient transported to CT 

## 2020-04-23 NOTE — Discharge Instructions (Addendum)
You have left lower quadrant pain your lab work and imaging look reassuring.  Started you on Bentyl which is an antispasmodic please use as needed for stomach pain.  Also recommend using MiraLAX for the next 2 weeks, you may do 1 capful per day.  Please drink plenty fluids, increase your fiber intake, and exercises get help with abdominal pain.  follow-up with a primary care doctor for further evaluation.  Given contact information above please call.  Come back to the emergency department if you develop chest pain, shortness of breath, severe abdominal pain, uncontrolled nausea, vomiting, diarrhea.

## 2020-04-23 NOTE — ED Notes (Signed)
UA SENT TO THE LAB AT THIS TIME
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
## 2020-04-23 NOTE — ED Notes (Signed)
RECEIVED VERBAL REPORT FROM PAIGE AT THIS TIME

## 2020-05-06 ENCOUNTER — Telehealth: Payer: Self-pay

## 2020-05-06 NOTE — Telephone Encounter (Signed)
Call received from patient. He explained that [name] had given him the phone number for this case manager.  He is in need of prescriptions for prozac and trazodone in order to be admitted for treatment at [facility].  He has no PCP. Informed him that he has an option of being seen at our mobile medical unit but there is no guarantee that the medications will be prescribed.  He said he could not go to the [facility] today. Ex[plained to him that there are no appointments, its first come first served. Also explained hours and lunch schedule for MMU.  The MMU is at [facility] tomorrow and he is not sure that he is permitted in the vicinity of that facility.  This CM to check and call him back.   As per [place], CMA patient would not be permitted to come to [facility] at [facility], he could come to [facility] 05/08/2020.   Call placed to patient # [phone] to inform him of above noted information from [place].  HIPAA compliant message left with call back requested to this CM.   As per Charolette Child, RPH/[facility], the patient could get a one time free fill for the prozac and trazodone. He is uninsured

## 2020-05-06 NOTE — Telephone Encounter (Signed)
Pt returned call to Erskine Squibb but no one answered at [facility].  CB#  [phone]

## 2020-05-06 NOTE — Telephone Encounter (Signed)
Patient returning case managers phone call

## 2020-05-08 ENCOUNTER — Other Ambulatory Visit: Payer: Self-pay

## 2020-05-08 ENCOUNTER — Ambulatory Visit: Payer: Self-pay | Admitting: Physician Assistant

## 2020-05-08 VITALS — BP 122/82 | HR 88 | Temp 98.2°F | Resp 18 | Ht 68.0 in | Wt 182.0 lb

## 2020-05-08 DIAGNOSIS — F1024 Alcohol dependence with alcohol-induced mood disorder: Secondary | ICD-10-CM

## 2020-05-08 DIAGNOSIS — F1410 Cocaine abuse, uncomplicated: Secondary | ICD-10-CM

## 2020-05-08 DIAGNOSIS — F330 Major depressive disorder, recurrent, mild: Secondary | ICD-10-CM | POA: Insufficient documentation

## 2020-05-08 DIAGNOSIS — G4700 Insomnia, unspecified: Secondary | ICD-10-CM

## 2020-05-08 MED ORDER — TRAZODONE HCL 150 MG PO TABS: 150.0000 mg | ORAL_TABLET | Freq: Every day | ORAL | 2 refills | Status: AC

## 2020-05-08 MED ORDER — FLUOXETINE HCL 20 MG PO CAPS: 20.0000 mg | ORAL_CAPSULE | Freq: Every day | ORAL | 2 refills | Status: AC

## 2020-05-08 NOTE — Progress Notes (Signed)
Patient has taken medication today and has eaten today. Patient denies pain at this time.

## 2020-05-08 NOTE — Patient Instructions (Signed)
Refills of your medication were sent to the pharmacy.  Please feel free to return to the mobile unit after your discharge if you need any assistance with medication management.  We are scheduling you an appointment so you can have a primary care provider, they will give you information at that time on [place] financial assistance.  Please let us know if there is anything else we can do for you  Roney Jaffe, PA-C Physician Assistant [facility] Medicine [email or web address]    Health Maintenance, Male Adopting a healthy lifestyle and getting preventive care are important in promoting health and wellness. Ask your health care provider about:  The right schedule for you to have regular tests and exams.  Things you can do on your own to prevent diseases and keep yourself healthy. What should I know about diet, weight, and exercise? Eat a healthy diet  Eat a diet that includes plenty of vegetables, fruits, low-fat dairy products, and lean protein.  Do not eat a lot of foods that are high in solid fats, added sugars, or sodium.   Maintain a healthy weight Body mass index (BMI) is a measurement that can be used to identify possible weight problems. It estimates body fat based on height and weight. Your health care provider can help determine your BMI and help you achieve or maintain a healthy weight. Get regular exercise Get regular exercise. This is one of the most important things you can do for your health. Most adults should:  Exercise for at least 150 minutes each week. The exercise should increase your heart rate and make you sweat (moderate-intensity exercise).  Do strengthening exercises at least twice a week. This is in addition to the moderate-intensity exercise.  Spend less time sitting. Even light physical activity can be beneficial. Watch cholesterol and blood lipids Have your blood tested for lipids and cholesterol at 48  years of age, then have this test every 5 years. You may need to have your cholesterol levels checked more often if:  Your lipid or cholesterol levels are high.  You are older than 48 years of age.  You are at high risk for heart disease. What should I know about cancer screening? Many types of cancers can be detected early and may often be prevented. Depending on your health history and family history, you may need to have cancer screening at various ages. This may include screening for:  Colorectal cancer.  Prostate cancer.  Skin cancer.  Lung cancer. What should I know about heart disease, diabetes, and high blood pressure? Blood pressure and heart disease  High blood pressure causes heart disease and increases the risk of stroke. This is more likely to develop in people who have high blood pressure readings, are of African descent, or are overweight.  Talk with your health care provider about your target blood pressure readings.  Have your blood pressure checked: ? Every 3-5 years if you are 14-25 years of age. ? Every year if you are 48 years old or older.  If you are between the ages of 48 and 48 and are a current or former smoker, ask your health care provider if you should have a one-time screening for abdominal aortic aneurysm (AAA). Diabetes Have regular diabetes screenings. This checks your fasting blood sugar level. Have the screening done:  Once every three years after age 75 if you are at a normal weight and have a low risk for diabetes.  More often and at a younger  age if you are overweight or have a high risk for diabetes. What should I know about preventing infection? Hepatitis B If you have a higher risk for hepatitis B, you should be screened for this virus. Talk with your health care provider to find out if you are at risk for hepatitis B infection. Hepatitis C Blood testing is recommended for:  Everyone born from [birth year] through 1963.  Anyone with known  risk factors for hepatitis C. Sexually transmitted infections (STIs)  You should be screened each year for STIs, including gonorrhea and chlamydia, if: ? You are sexually active and are younger than 48 years of age. ? You are older than 48 years of age and your health care provider tells you that you are at risk for this type of infection. ? Your sexual activity has changed since you were last screened, and you are at increased risk for chlamydia or gonorrhea. Ask your health care provider if you are at risk.  Ask your health care provider about whether you are at high risk for HIV. Your health care provider may recommend a prescription medicine to help prevent HIV infection. If you choose to take medicine to prevent HIV, you should first get tested for HIV. You should then be tested every 3 months for as long as you are taking the medicine. Follow these instructions at home: Lifestyle  Do not use any products that contain nicotine or tobacco, such as cigarettes, e-cigarettes, and chewing tobacco. If you need help quitting, ask your health care provider.  Do not use street drugs.  Do not share needles.  Ask your health care provider for help if you need support or information about quitting drugs. Alcohol use  Do not drink alcohol if your health care provider tells you not to drink.  If you drink alcohol: ? Limit how much you have to 0-2 drinks a day. ? Be aware of how much alcohol is in your drink. In the [place] one drink equals one 12 oz bottle of beer (355 mL), one 5 oz glass of wine (148 mL), or one 1 oz glass of hard liquor (44 mL). General instructions  Schedule regular health, dental, and eye exams.  Stay current with your vaccines.  Tell your health care provider if: ? You often feel depressed. ? You have ever been abused or do not feel safe at home. Summary  Adopting a healthy lifestyle and getting preventive care are important in promoting health and wellness.  Follow  your health care provider's instructions about healthy diet, exercising, and getting tested or screened for diseases.  Follow your health care provider's instructions on monitoring your cholesterol and blood pressure. This information is not intended to replace advice given to you by your health care provider. Make sure you discuss any questions you have with your health care provider. Document Revised: 01/04/2018 Document Reviewed: 01/04/2018 Elsevier Patient Education  2021 [employer].
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
## 2020-07-01 ENCOUNTER — Ambulatory Visit: Payer: Self-pay | Admitting: Critical Care Medicine

## 2020-07-01 NOTE — Progress Notes (Deleted)
   Subjective:    Patient ID: Wyatt Collins, male    DOB: 15-May-1970, 50 y.o.   MRN: [id number]  07/01/2020 Here to est pcp.  Saw Mayers in April Wyatt Collins reports that he is going to be a resident at [facility] for the next 30 days, states that he requires current prescriptions of his behavioral health medications.  Reports that he previously was prescribed Prozac 20 mg and trazodone 150 mg.  Reports that he just restarted these medications a few days ago, states that he was not compliant to his medications while "on the street".  Does endorse when he was previously taking Prozac as described he did feel he was more stable.  Reports the trazodone 150 mg does offer relief from his insomnia, denies any adverse effects.  Reports that he takes Pepcid on a daily basis, states that he purchases this over-the-counter.  Reports his GERD symptoms are well controlled with the Pepcid.  1. Mild episode of recurrent major depressive disorder (HCC) Continue current regimen.  Patient given appointment to establish care with Dr. Delford Field on July 01, 2020.  Patient encouraged to return to mobile unit if needed for behavioral health medication management.  Reviewed recent labs from ED visit April 23, 2020. - FLUoxetine (PROZAC) 20 MG capsule; Take 1 capsule (20 mg total) by mouth daily.  Dispense: 30 capsule; Refill: 2  2. Insomnia, unspecified type Continue current regimen - traZODone (DESYREL) 150 MG tablet; Take 1 tablet (150 mg total) by mouth at bedtime.  Dispense: 30 tablet; Refill: 2  3. Alcohol dependence with alcohol-induced mood disorder (HCC)   4. Cocaine abuse (HCC)     Review of Systems     Objective:   Physical Exam        Assessment & Plan:

## 2020-07-17 ENCOUNTER — Encounter (HOSPITAL_COMMUNITY): Payer: Self-pay | Admitting: Pharmacy Technician

## 2020-07-17 ENCOUNTER — Emergency Department (HOSPITAL_COMMUNITY)
Admission: EM | Admit: 2020-07-17 | Discharge: 2020-07-17 | Discharge status: Against Medical Advice | Payer: Self-pay | Attending: Physician Assistant | Admitting: Physician Assistant

## 2020-07-17 DIAGNOSIS — U071 COVID-19: Secondary | ICD-10-CM | POA: Insufficient documentation

## 2020-07-17 DIAGNOSIS — Z20822 Contact with and (suspected) exposure to covid-19: Secondary | ICD-10-CM

## 2020-07-17 DIAGNOSIS — Z5321 Procedure and treatment not carried out due to patient leaving prior to being seen by health care provider: Secondary | ICD-10-CM | POA: Insufficient documentation

## 2020-07-17 LAB — SARS CORONAVIRUS 2 (TAT 6-24 HRS): SARS Coronavirus 2: POSITIVE — AB

## 2020-07-17 NOTE — ED Provider Notes (Signed)
Emergency Medicine Provider Triage Evaluation Note  Wyatt Collins , a 50 y.o. male  was evaluated in triage.  Pt complains of since yesterday he has had fatigue, body aches, and generally feeling poorly.  He is vaccinated is not blistered He attempted to go to work today and was told he needed to get checked out for COVID.  No COVID test taken prior to arrival.  Review of Systems  Positive: Fatigue, myalgias Negative: Shortness of breath  Physical Exam  There were no vitals taken for this visit. Gen:   Awake, no distress   Resp:  Normal effort  MSK:   Moves extremities without difficulty  Other:  Patient is awake and alert no obvious distress.  Speech is not slurred.  Answers questions appropriately.  Medical Decision Making  Medically screening exam initiated at 2:23 PM.  Appropriate orders placed.  Wyatt Collins was informed that the remainder of the evaluation will be completed by another provider, this initial triage assessment does not replace that evaluation, and the importance of remaining in the ED until their evaluation is complete.  Wyatt Collins was evaluated in Emergency Department on 07/17/2020 for the symptoms described in the history of present illness. He was evaluated in the context of the global COVID-19 pandemic, which necessitated consideration that the patient might be at risk for infection with the SARS-CoV-2 virus that causes COVID-19. Institutional protocols and algorithms that pertain to the evaluation of patients at risk for COVID-19 are in a state of rapid change based on information released by regulatory bodies including the CDC and federal and state organizations. These policies and algorithms were followed during the patient's care in the ED.  Patient is 97% on room air.  Will order 6 to 24-hour COVID test.  He is instructed that even if that test returns negative with his symptoms in the setting of the global pandemic the most appropriate thing would be for  him to quarantine/isolate.   Cristina Gong, PA-C 07/17/20 1424    Tilden Fossa, MD 07/17/20 [phone]

## 2020-07-17 NOTE — ED Notes (Signed)
Unable to find pt in lobby. 

## 2020-07-17 NOTE — ED Notes (Signed)
Called over intercom and still no answer. Moving pt OTF

## 2020-07-17 NOTE — ED Triage Notes (Signed)
Pt here with fatigue and generalized body aches. Pt denies known sick contacts.

## 2020-07-18 ENCOUNTER — Other Ambulatory Visit: Payer: Self-pay

## 2020-07-18 ENCOUNTER — Emergency Department (HOSPITAL_COMMUNITY)
Admission: EM | Admit: 2020-07-18 | Discharge: 2020-07-18 | Disposition: A | Discharge status: Home / Self Care | Payer: Self-pay | Attending: Emergency Medicine | Admitting: Emergency Medicine

## 2020-07-18 ENCOUNTER — Encounter (HOSPITAL_COMMUNITY): Payer: Self-pay

## 2020-07-18 DIAGNOSIS — U071 COVID-19: Secondary | ICD-10-CM | POA: Insufficient documentation

## 2020-07-18 DIAGNOSIS — F17210 Nicotine dependence, cigarettes, uncomplicated: Secondary | ICD-10-CM | POA: Insufficient documentation

## 2020-07-18 NOTE — Discharge Instructions (Addendum)
You have tested positive for COVID-19 virus.  Please continue to quarantine at home and monitor your symptoms closely. You chest x-ray was clear. Antibiotics are not helpful in treating viral infection, the virus should run its course in about 7-10 days. Please make sure you are drinking plenty of fluids. You can treat your symptoms supportively with tylenol 1000 mg and ibuprofen 600 mg every 6 hours for fevers and pains, and over the counter cough syrups and throat lozenges to help with cough. If your symptoms are not improving please follow up with you Primary doctor.   I recommend that you purchase a home pulse ox to help better monitor your oxygen at home, if you start to have increased work of breathing or shortness of breath or your oxygen drops below 90% please immediately return to the hospital for reevaluation.  If you develop persistent fevers, shortness of breath or difficulty breathing, chest pain, severe headache and neck pain, persistent nausea and vomiting or other new or concerning symptoms return to the Emergency department.  

## 2020-07-18 NOTE — ED Triage Notes (Signed)
Pt states he is here for paperwork showing that he tested positive for COVID. Pt was here yesterday, however left from the waiting room (eloped). Pt c/o generalized body aches and HA. Pt unsure if he wants treatment for symptoms or if he will treat them at home. Denies CP/SOB at this time.
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
## 2021-01-16 ENCOUNTER — Encounter (HOSPITAL_COMMUNITY): Payer: Self-pay | Admitting: Emergency Medicine

## 2021-01-16 ENCOUNTER — Other Ambulatory Visit: Payer: Self-pay

## 2021-01-16 ENCOUNTER — Emergency Department (HOSPITAL_COMMUNITY)
Admission: EM | Admit: 2021-01-16 | Discharge: 2021-01-16 | Disposition: A | Discharge status: Home / Self Care | Payer: Self-pay | Attending: Emergency Medicine | Admitting: Emergency Medicine

## 2021-01-16 ENCOUNTER — Emergency Department (HOSPITAL_COMMUNITY): Payer: Self-pay

## 2021-01-16 DIAGNOSIS — Z5321 Procedure and treatment not carried out due to patient leaving prior to being seen by health care provider: Secondary | ICD-10-CM | POA: Insufficient documentation

## 2021-01-16 DIAGNOSIS — R059 Cough, unspecified: Secondary | ICD-10-CM | POA: Insufficient documentation

## 2021-01-16 DIAGNOSIS — R0789 Other chest pain: Secondary | ICD-10-CM | POA: Insufficient documentation

## 2021-01-16 LAB — BASIC METABOLIC PANEL
Anion gap: 8 (ref 5–15)
BUN: 12 mg/dL (ref 6–20)
CO2: 24 mmol/L (ref 22–32)
Calcium: 9 mg/dL (ref 8.9–10.3)
Chloride: 103 mmol/L (ref 98–111)
Creatinine, Ser: 1.07 mg/dL (ref 0.61–1.24)
GFR, Estimated: >60 mL/min (ref >60)
Glucose, Bld: 87 mg/dL (ref 70–99)
Potassium: 3.7 mmol/L (ref 3.5–5.1)
Sodium: 135 mmol/L (ref 135–145)

## 2021-01-16 LAB — CBC WITH DIFFERENTIAL/PLATELET
Abs Immature Granulocytes: 0.02 10*3/uL (ref 0.00–0.07)
Basophils Absolute: 0.1 10*3/uL (ref 0.0–0.1)
Basophils Relative: 1 %
Eosinophils Absolute: 0.2 10*3/uL (ref 0.0–0.5)
Eosinophils Relative: 2 %
HCT: 43.1 % (ref 39.0–52.0)
Hemoglobin: 14.1 g/dL (ref 13.0–17.0)
Immature Granulocytes: 0 %
Lymphocytes Relative: 34 %
Lymphs Abs: 2.6 10*3/uL (ref 0.7–4.0)
MCH: 33.1 pg (ref 26.0–34.0)
MCHC: 32.7 g/dL (ref 30.0–36.0)
MCV: 101.2 fL — ABNORMAL HIGH (ref 80.0–100.0)
Monocytes Absolute: 0.5 10*3/uL (ref 0.1–1.0)
Monocytes Relative: 7 %
Neutro Abs: 4.3 10*3/uL (ref 1.7–7.7)
Neutrophils Relative %: 56 %
Platelets: 265 10*3/uL (ref 150–400)
RBC: 4.26 MIL/uL (ref 4.22–5.81)
RDW: 12 % (ref 11.5–15.5)
WBC: 7.8 10*3/uL (ref 4.0–10.5)
nRBC: 0 % (ref 0.0–0.2)

## 2021-01-16 LAB — TROPONIN I (HIGH SENSITIVITY): Troponin I (High Sensitivity): 8 ng/L (ref <18)

## 2021-01-16 NOTE — ED Notes (Signed)
Pt called for vitals, no response. 

## 2021-01-16 NOTE — ED Notes (Signed)
Pt called for vitals x2, no response. 

## 2021-01-16 NOTE — ED Triage Notes (Signed)
Pt here for R side cp that started today around noon, pt states he felt a pull in the muscle while he was coughing. Pt states he does a lot of pushing heavy things at work and has been sore on the R ribcage.

## 2021-01-16 NOTE — ED Provider Notes (Addendum)
Emergency Medicine Provider Triage Evaluation Note  Wyatt Collins , a 50 y.o. male  was evaluated in triage.  Pt complains of right sided chest pain since noon today. Endorses worse with deep breathing, worse with cough. Smokes 1 PPD cigarettes and marijuana. Denies left sided chest pain, arm pain, numbness. Denies nausea, vomiting, diaphoresis, fever, chills. No recent travel. No leg pain, swelling. Does report stretching / tearing muscular movement at work.  Review of Systems  Positive: Pleuritic chest pain, shortness of breath Negative: As above  Physical Exam  BP 127/89 (BP Location: Right Arm)    Pulse 91    Temp 98 F (36.7 C) (Oral)    Resp 16    SpO2 100%  Gen:   Awake, no distress Resp:  Normal effort  MSK:   Moves extremities without difficulty  Other:  Replicable chest pain with cough, ttp around right serratus, some pain with pushing movements right arm  Medical Decision Making  Medically screening exam initiated at 2:39 PM.  Appropriate orders placed.  [name] was informed that the remainder of the evaluation will be completed by another provider, this initial triage assessment does not replace that evaluation, and the importance of remaining in the ED until their evaluation is complete.  Pleuritic chest pain      [place] 01/16/21 1443    Gerhard Munch, MD 01/19/21 2009

## 2021-01-18 ENCOUNTER — Encounter (HOSPITAL_COMMUNITY): Payer: Self-pay

## 2021-01-18 ENCOUNTER — Emergency Department (HOSPITAL_COMMUNITY)
Admission: EM | Admit: 2021-01-18 | Discharge: 2021-01-18 | Disposition: A | Discharge status: Home / Self Care | Payer: Self-pay | Attending: Emergency Medicine | Admitting: Emergency Medicine

## 2021-01-18 ENCOUNTER — Other Ambulatory Visit: Payer: Self-pay

## 2021-01-18 ENCOUNTER — Emergency Department (HOSPITAL_COMMUNITY): Payer: Self-pay

## 2021-01-18 DIAGNOSIS — R0789 Other chest pain: Secondary | ICD-10-CM | POA: Insufficient documentation

## 2021-01-18 DIAGNOSIS — F17210 Nicotine dependence, cigarettes, uncomplicated: Secondary | ICD-10-CM | POA: Insufficient documentation

## 2021-01-18 LAB — CBC
HCT: 43.8 % (ref 39.0–52.0)
Hemoglobin: 14.4 g/dL (ref 13.0–17.0)
MCH: 33.3 pg (ref 26.0–34.0)
MCHC: 32.9 g/dL (ref 30.0–36.0)
MCV: 101.4 fL — ABNORMAL HIGH (ref 80.0–100.0)
Platelets: 267 10*3/uL (ref 150–400)
RBC: 4.32 MIL/uL (ref 4.22–5.81)
RDW: 11.9 % (ref 11.5–15.5)
WBC: 7.6 10*3/uL (ref 4.0–10.5)
nRBC: 0 % (ref 0.0–0.2)

## 2021-01-18 LAB — BASIC METABOLIC PANEL
Anion gap: 4 — ABNORMAL LOW (ref 5–15)
BUN: 13 mg/dL (ref 6–20)
CO2: 30 mmol/L (ref 22–32)
Calcium: 9.3 mg/dL (ref 8.9–10.3)
Chloride: 104 mmol/L (ref 98–111)
Creatinine, Ser: 1.27 mg/dL — ABNORMAL HIGH (ref 0.61–1.24)
GFR, Estimated: >60 mL/min (ref >60)
Glucose, Bld: 89 mg/dL (ref 70–99)
Potassium: 4.2 mmol/L (ref 3.5–5.1)
Sodium: 138 mmol/L (ref 135–145)

## 2021-01-18 LAB — TROPONIN I (HIGH SENSITIVITY): Troponin I (High Sensitivity): 7 ng/L (ref <18)

## 2021-01-18 LAB — D-DIMER, QUANTITATIVE: D-Dimer, Quant: <0.27 ug/mL-FEU (ref 0.00–0.50)
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
## 2021-01-18 NOTE — ED Notes (Signed)
Pt return from XR.

## 2021-01-18 NOTE — Discharge Instructions (Addendum)
Your work-up in the ER did not show signs of heart attack or blood clots or life-threatening infection.  We talked about the possibility that you have a strain in your rib muscles, or also a small hairline fracture of the ribs which we cannot see on the x-rays.  Both of these condition should improve over the next week or 2.

## 2021-01-18 NOTE — ED Notes (Signed)
Pt NAD, a/ox4. Pt verbalizes understanding of all DC and f/u instructions. All questions answered. Pt walks with steady gait to lobby at DC.  ? ?

## 2021-01-18 NOTE — ED Notes (Signed)
Pt NAD in bed, a/ox4, speaking in full and complete sentences. Pt c/o 4/10 intermittent right axillary/anterior CP that increases with movement and inspiration. Pt states he thinks he pulled a muscle while working last week but is not sure. Pt denies other CP, SOB, nausea, dizziness or cardiac HX

## 2021-01-18 NOTE — ED Notes (Signed)
Patient transported to X-ray 

## 2021-01-18 NOTE — ED Triage Notes (Signed)
Pt arrived POV from home c/o of back pain. Pt was just here 2 days ago for the same issue. Pt states he feels like he pulled the muscle more that it is a little more sore today and wants it checked out again. Pt states the pain wraps around his back to the side of his chest.

## 2022-03-21 ENCOUNTER — Other Ambulatory Visit: Payer: Self-pay

## 2022-03-21 ENCOUNTER — Emergency Department (HOSPITAL_COMMUNITY): Payer: Self-pay

## 2022-03-21 ENCOUNTER — Encounter (HOSPITAL_COMMUNITY): Payer: Self-pay

## 2022-03-21 ENCOUNTER — Emergency Department (HOSPITAL_COMMUNITY)
Admission: EM | Admit: 2022-03-21 | Discharge: 2022-03-21 | Disposition: A | Discharge status: Home / Self Care | Payer: Self-pay | Attending: Emergency Medicine | Admitting: Emergency Medicine

## 2022-03-21 DIAGNOSIS — R079 Chest pain, unspecified: Secondary | ICD-10-CM

## 2022-03-21 DIAGNOSIS — Z1152 Encounter for screening for COVID-19: Secondary | ICD-10-CM | POA: Insufficient documentation

## 2022-03-21 DIAGNOSIS — J101 Influenza due to other identified influenza virus with other respiratory manifestations: Secondary | ICD-10-CM | POA: Insufficient documentation

## 2022-03-21 LAB — CBC
HCT: 41.9 % (ref 39.0–52.0)
Hemoglobin: 14.6 g/dL (ref 13.0–17.0)
MCH: 32.7 pg (ref 26.0–34.0)
MCHC: 34.8 g/dL (ref 30.0–36.0)
MCV: 93.9 fL (ref 80.0–100.0)
Platelets: 205 10*3/uL (ref 150–400)
RBC: 4.46 MIL/uL (ref 4.22–5.81)
RDW: 12 % (ref 11.5–15.5)
WBC: 8.9 10*3/uL (ref 4.0–10.5)
nRBC: 0 % (ref 0.0–0.2)

## 2022-03-21 LAB — BASIC METABOLIC PANEL
Anion gap: 14 (ref 5–15)
BUN: 14 mg/dL (ref 6–20)
CO2: 22 mmol/L (ref 22–32)
Calcium: 8.5 mg/dL — ABNORMAL LOW (ref 8.9–10.3)
Chloride: 99 mmol/L (ref 98–111)
Creatinine, Ser: 1.17 mg/dL (ref 0.61–1.24)
GFR, Estimated: >60 mL/min (ref >60)
Glucose, Bld: 194 mg/dL — ABNORMAL HIGH (ref 70–99)
Potassium: 3.4 mmol/L — ABNORMAL LOW (ref 3.5–5.1)
Sodium: 135 mmol/L (ref 135–145)

## 2022-03-21 LAB — RESP PANEL BY RT-PCR (RSV, FLU A&B, COVID)  RVPGX2
Influenza A by PCR: NEGATIVE
Influenza B by PCR: POSITIVE — AB
Resp Syncytial Virus by PCR: NEGATIVE
SARS Coronavirus 2 by RT PCR: NEGATIVE

## 2022-03-21 LAB — TROPONIN I (HIGH SENSITIVITY)
Troponin I (High Sensitivity): 6 ng/L (ref <18)
Troponin I (High Sensitivity): 6 ng/L (ref <18)

## 2022-03-21 MED ORDER — LACTATED RINGERS IV SOLN: INTRAVENOUS | Status: DC

## 2022-03-21 MED ORDER — LACTATED RINGERS IV BOLUS
1000.0000 mL | Freq: Once | INTRAVENOUS | Status: AC
  Administered 2022-03-21: 1000 mL via INTRAVENOUS

## 2022-03-21 NOTE — ED Triage Notes (Signed)
Pt c/o HA, fatigue, right sided chest pain, right upper back pain, diarrhea, runny nosex3d.

## 2022-03-21 NOTE — ED Provider Notes (Addendum)
[facility] Provider Note   CSN: [id number] Arrival date & time: 03/21/22  1019     History  Chief Complaint  Patient presents with   Chest Pain    Wyatt Collins is a 52 y.o. male.  52 year old male presents with headache, fatigue, chest congestion, rhinorrhea times several days.  He is also had watery diarrhea.  No nausea or vomiting.  History of COPD.  Cough has been nonproductive.  Denies a severe headaches or photophobia.  No neck pain.  Has medicated with over-the-counter medications without relief.       Home Medications Prior to Admission medications   Medication Sig Start Date End Date Taking? Authorizing Provider  cyclobenzaprine (FLEXERIL) 10 MG tablet Take 1 tablet (10 mg total) by mouth 2 (two) times daily as needed for muscle spasms. Patient not taking: No sig reported 08/02/18   Domenic Moras, PA-C  diclofenac sodium (VOLTAREN) 1 % GEL Apply 2 g topically 4 (four) times daily. Patient not taking: No sig reported 08/02/18   Domenic Moras, PA-C  dicyclomine (BENTYL) 20 MG tablet Take 1 tablet (20 mg total) by mouth 2 (two) times daily. Patient not taking: Reported on 05/08/2020 04/23/20   Marcello Fennel, PA-C  famotidine (PEPCID) 20 MG tablet Take 20 mg by mouth daily. 07/06/19   [provider]  FLUoxetine (PROZAC) 20 MG capsule Take 1 capsule (20 mg total) by mouth daily. 05/08/20   Mayers, Cari S, PA-C  traZODone (DESYREL) 150 MG tablet Take 1 tablet (150 mg total) by mouth at bedtime. 05/08/20   Mayers, Cari S, PA-C      Allergies    Desipramine, Ibuprofen, and Tramadol    Review of Systems   Review of Systems  All other systems reviewed and are negative.   Physical Exam Updated Vital Signs BP 117/80 (BP Location: Left Arm)   Pulse 99   Temp 98.3 F (36.8 C) (Oral)   Resp 20   Ht 1.727 m ('5\' 8"'$ )   Wt 82.6 kg   SpO2 96%   BMI 27.69 kg/m  Physical Exam Vitals and nursing note reviewed.   Constitutional:      General: He is not in acute distress.    Appearance: Normal appearance. He is well-developed. He is not toxic-appearing.  HENT:     Head: Normocephalic and atraumatic.  Eyes:     General: Lids are normal.     Conjunctiva/sclera: Conjunctivae normal.     Pupils: Pupils are equal, round, and reactive to light.  Neck:     Thyroid: No thyroid mass.     Trachea: No tracheal deviation.  Cardiovascular:     Rate and Rhythm: Normal rate and regular rhythm.     Heart sounds: Normal heart sounds. No murmur heard.    No gallop.  Pulmonary:     Effort: Pulmonary effort is normal. No respiratory distress.     Breath sounds: Normal breath sounds. No stridor. No decreased breath sounds, wheezing, rhonchi or rales.  Abdominal:     General: There is no distension.     Palpations: Abdomen is soft.     Tenderness: There is no abdominal tenderness. There is no rebound.  Musculoskeletal:        General: No tenderness. Normal range of motion.     Cervical back: Normal range of motion and neck supple.  Skin:    General: Skin is warm and dry.     Findings: No abrasion  or rash.  Neurological:     Mental Status: He is alert and oriented to person, place, and time. Mental status is at baseline.     GCS: GCS eye subscore is 4. GCS verbal subscore is 5. GCS motor subscore is 6.     Cranial Nerves: No cranial nerve deficit.     Sensory: No sensory deficit.     Motor: Motor function is intact.  Psychiatric:        Attention and Perception: Attention normal.        Speech: Speech normal.        Behavior: Behavior normal.     ED Results / Procedures / Treatments   Labs (all labs ordered are listed, but only abnormal results are displayed) Labs Reviewed  RESP PANEL BY RT-PCR (RSV, FLU A&B, COVID)  [id number]  CBC  BASIC METABOLIC PANEL  TROPONIN I (HIGH SENSITIVITY)    EKG EKG Interpretation  Date/Time:  Sunday March 21 2022 10:10:22 EST Ventricular Rate:  116 PR  Interval:  128 QRS Duration: 80 QT Interval:  306 QTC Calculation: 425 R Axis:   98 Text Interpretation: Sinus tachycardia Rightward axis Borderline ECG When compared with ECG of 18-Jan-2021 14:47, PREVIOUS ECG IS PRESENT Confirmed by Lacretia Leigh ([id number]) on 03/21/2022 4:22:22 PM  Radiology DG Chest 2 View  Result Date: 03/21/2022 CLINICAL DATA:  Chest pain EXAM: CHEST - 2 VIEW COMPARISON:  CXR 01/18/21 FINDINGS: No pleural effusion. No pneumothorax. Normal cardiac and mediastinal contours. No focal airspace opacity. Redemonstrated are prominent interstitial opacities at the bilateral lung bases, which likely represent scarring. Vertebral body heights are maintained. No radiographically apparent displaced rib fracture. IMPRESSION: No radiographic finding to explain chest pain. Electronically Signed   By: Marin Roberts M.D.   On: 03/21/2022 11:21    Procedures Procedures    Medications Ordered in ED Medications - No data to display  ED Course/ Medical Decision Making/ A&P                             Medical Decision Making Amount and/or Complexity of Data Reviewed Labs: ordered. Radiology: ordered.  Risk Prescription drug management.   Patient with possible viral illness here.  Did have chest pain with sound more like URI symptoms.  If troponins were negative x 2.  EKG per interpretation shows sinus tachycardia.  Patient's x-ray per my interpretation showed no acute findings.  Patient did come back flu be positive.  He eloped before receiving his discharge instructions.        Final Clinical Impression(s) / ED Diagnoses Final diagnoses:  None    Rx / DC Orders ED Discharge Orders     None         Lacretia Leigh, MD 03/21/22 1622    Lacretia Leigh, MD 03/21/22 1623

## 2022-07-21 ENCOUNTER — Telehealth: Payer: Self-pay

## 2022-07-21 NOTE — Telephone Encounter (Addendum)
Wyatt Collins at the [facility] PCP followed up to see if he ever made an appointment. I did not find an appt and left a voicemail at [phone] asking him to call back for scheduling. This number was given to my by Wyatt Collins.  Please try to reach him for scheduling

## 2022-09-10 ENCOUNTER — Ambulatory Visit: Payer: Self-pay | Admitting: Nurse Practitioner

## 2022-09-16 IMAGING — CR DG CHEST 2V
2 series · 2 of 2 positions shown · non-contrast
Comparison: 04/23/2020

CLINICAL DATA: Chest pain

EXAM:
CHEST - 2 VIEW

[chest pa]
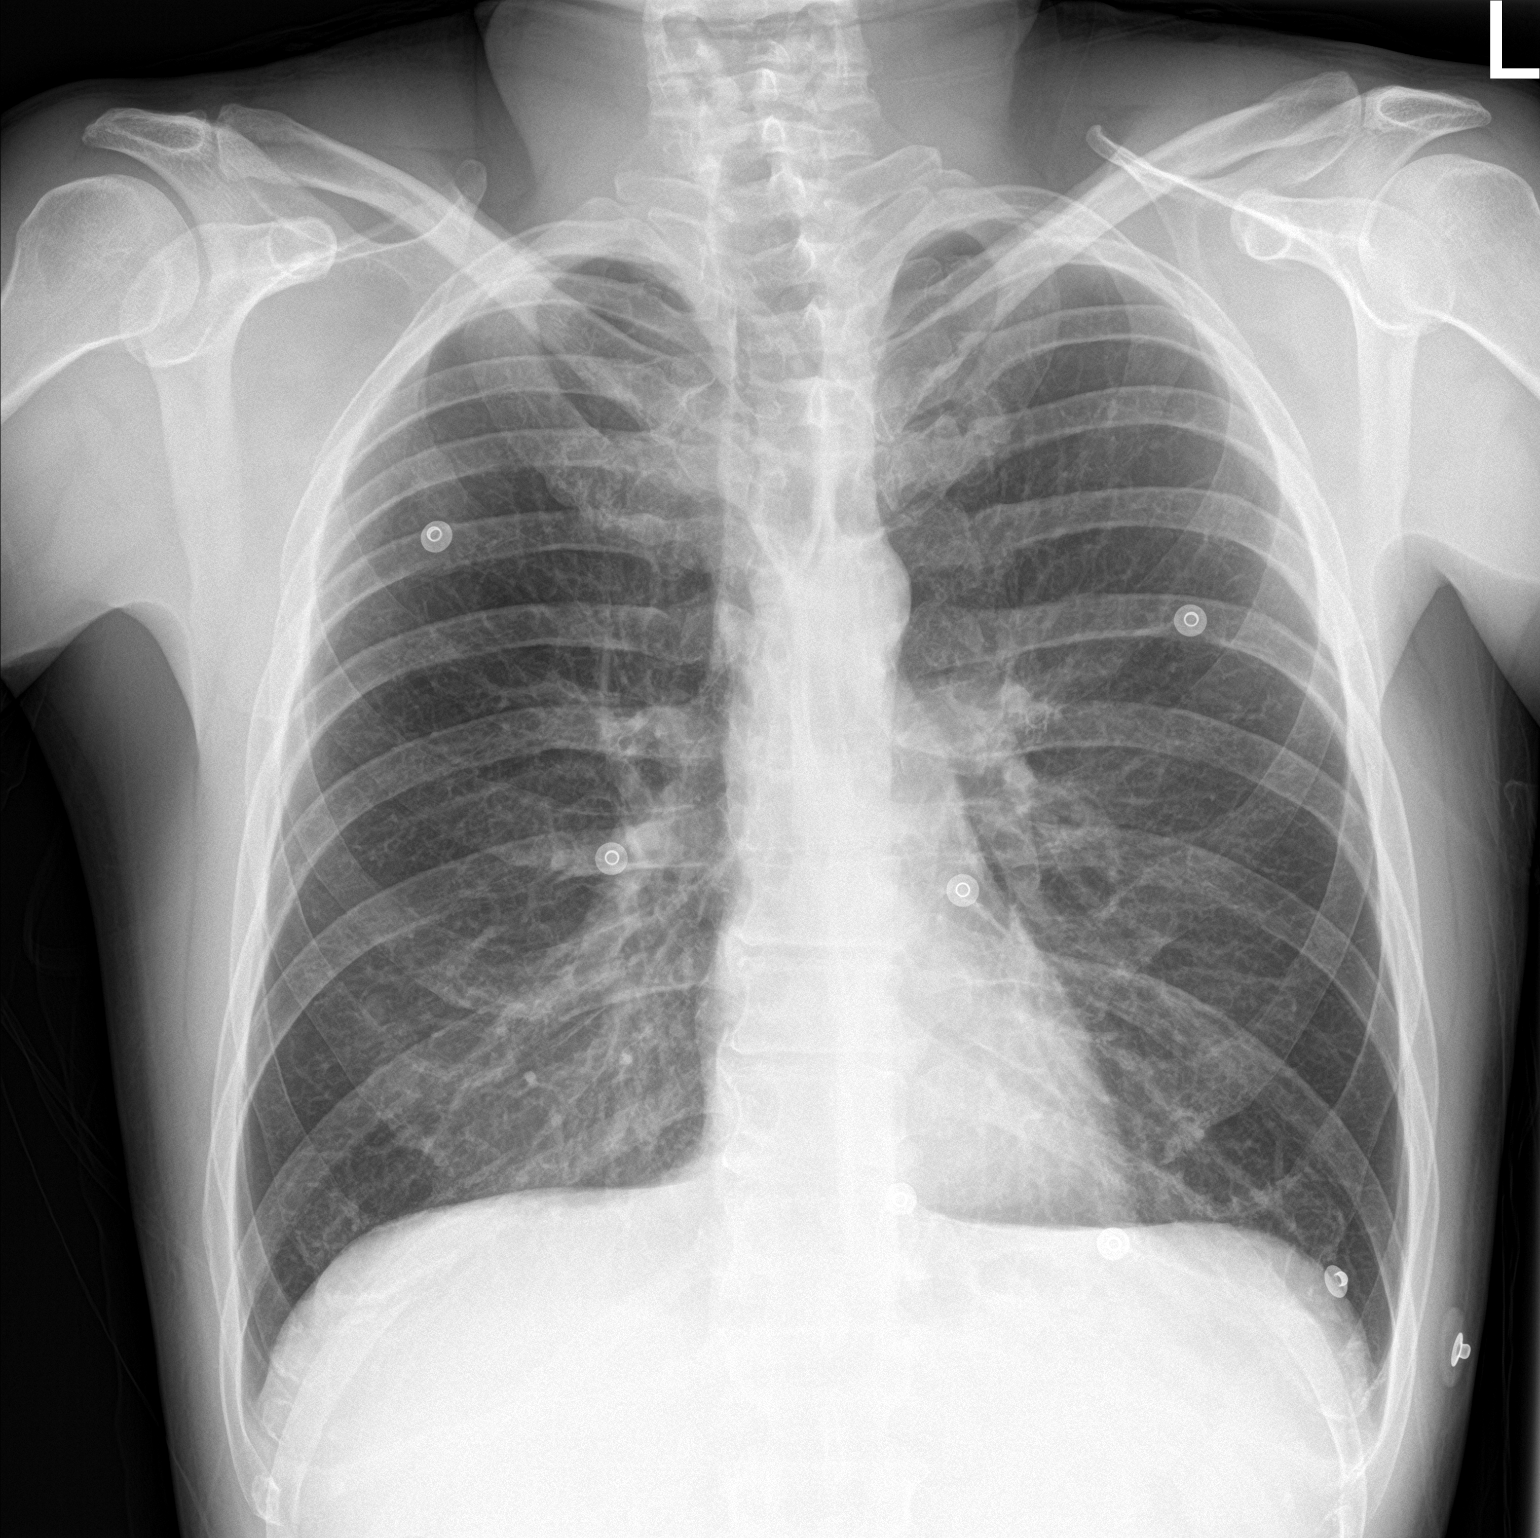

[chest lat]
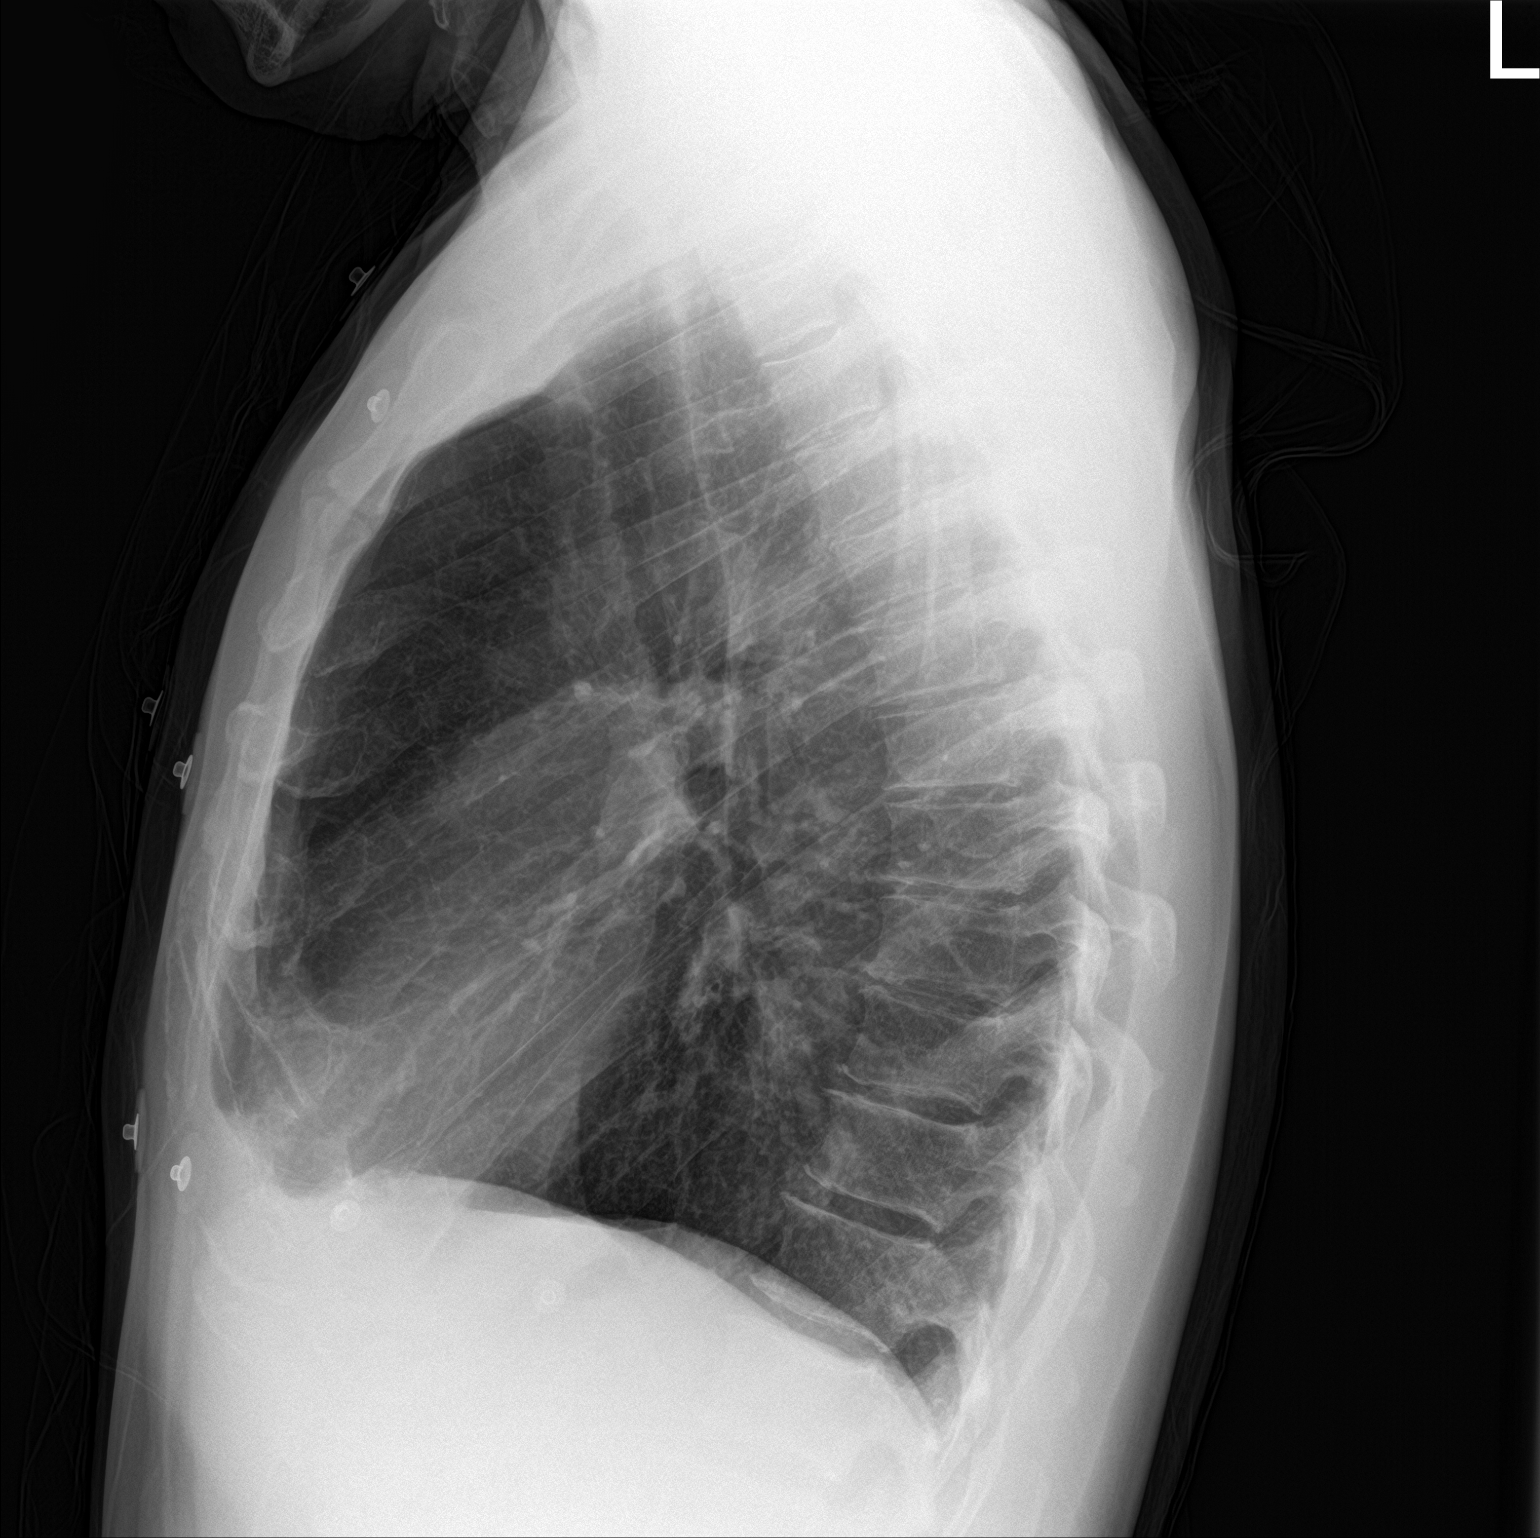

[2 of 2 positions shown; findings below may reference images not displayed]

FINDINGS: The heart size and mediastinal contours are within normal limits.
Both lungs are clear. The visualized skeletal structures are
unremarkable.
IMPRESSION: No active cardiopulmonary disease.

## 2022-09-18 IMAGING — CR DG CHEST 2V
2 series · 2 of 2 positions shown · non-contrast
Comparison: Prior chest radiographs 01/16/2021 and earlier.

CLINICAL DATA: Provided history: Chest pain.

EXAM:
CHEST - 2 VIEW

[chest pa]
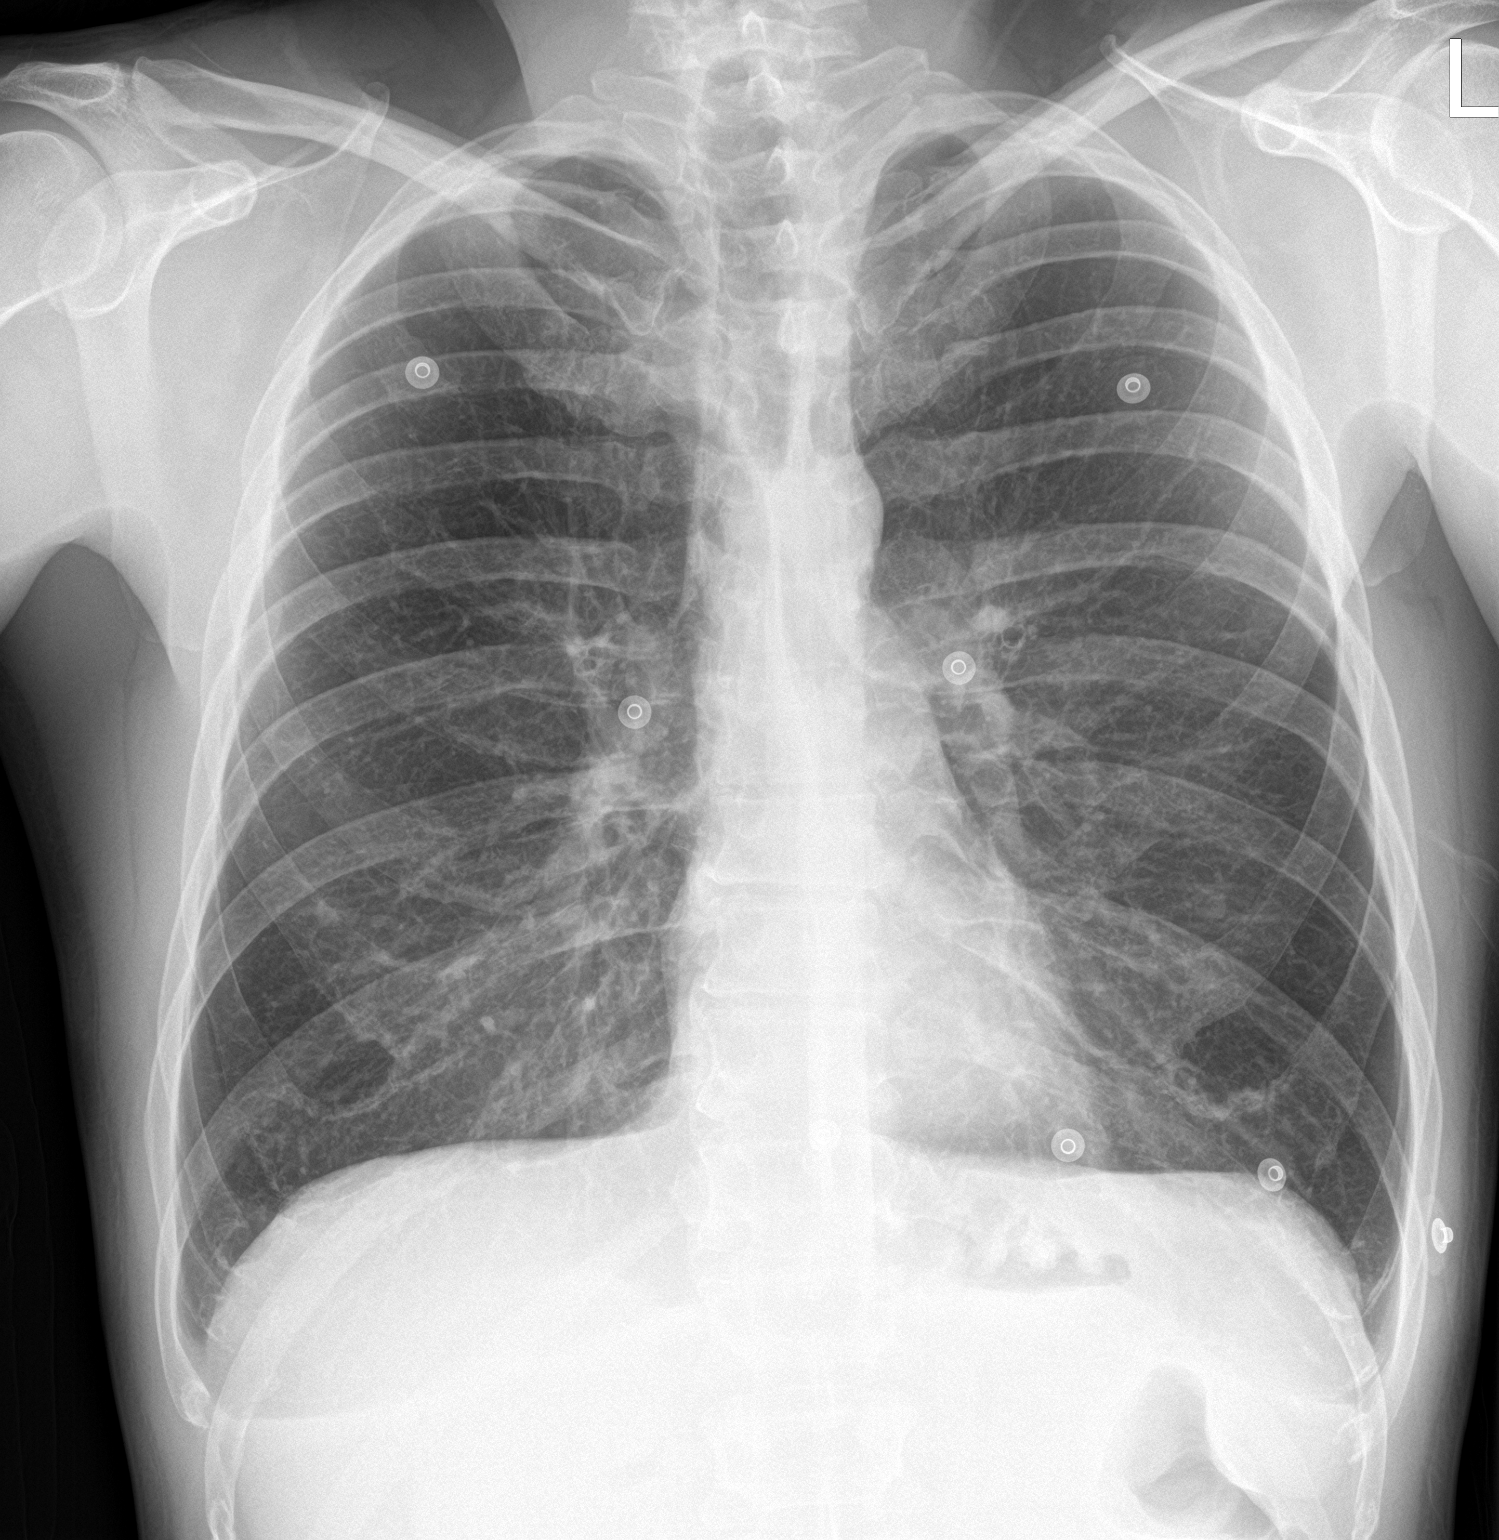

[chest lat]
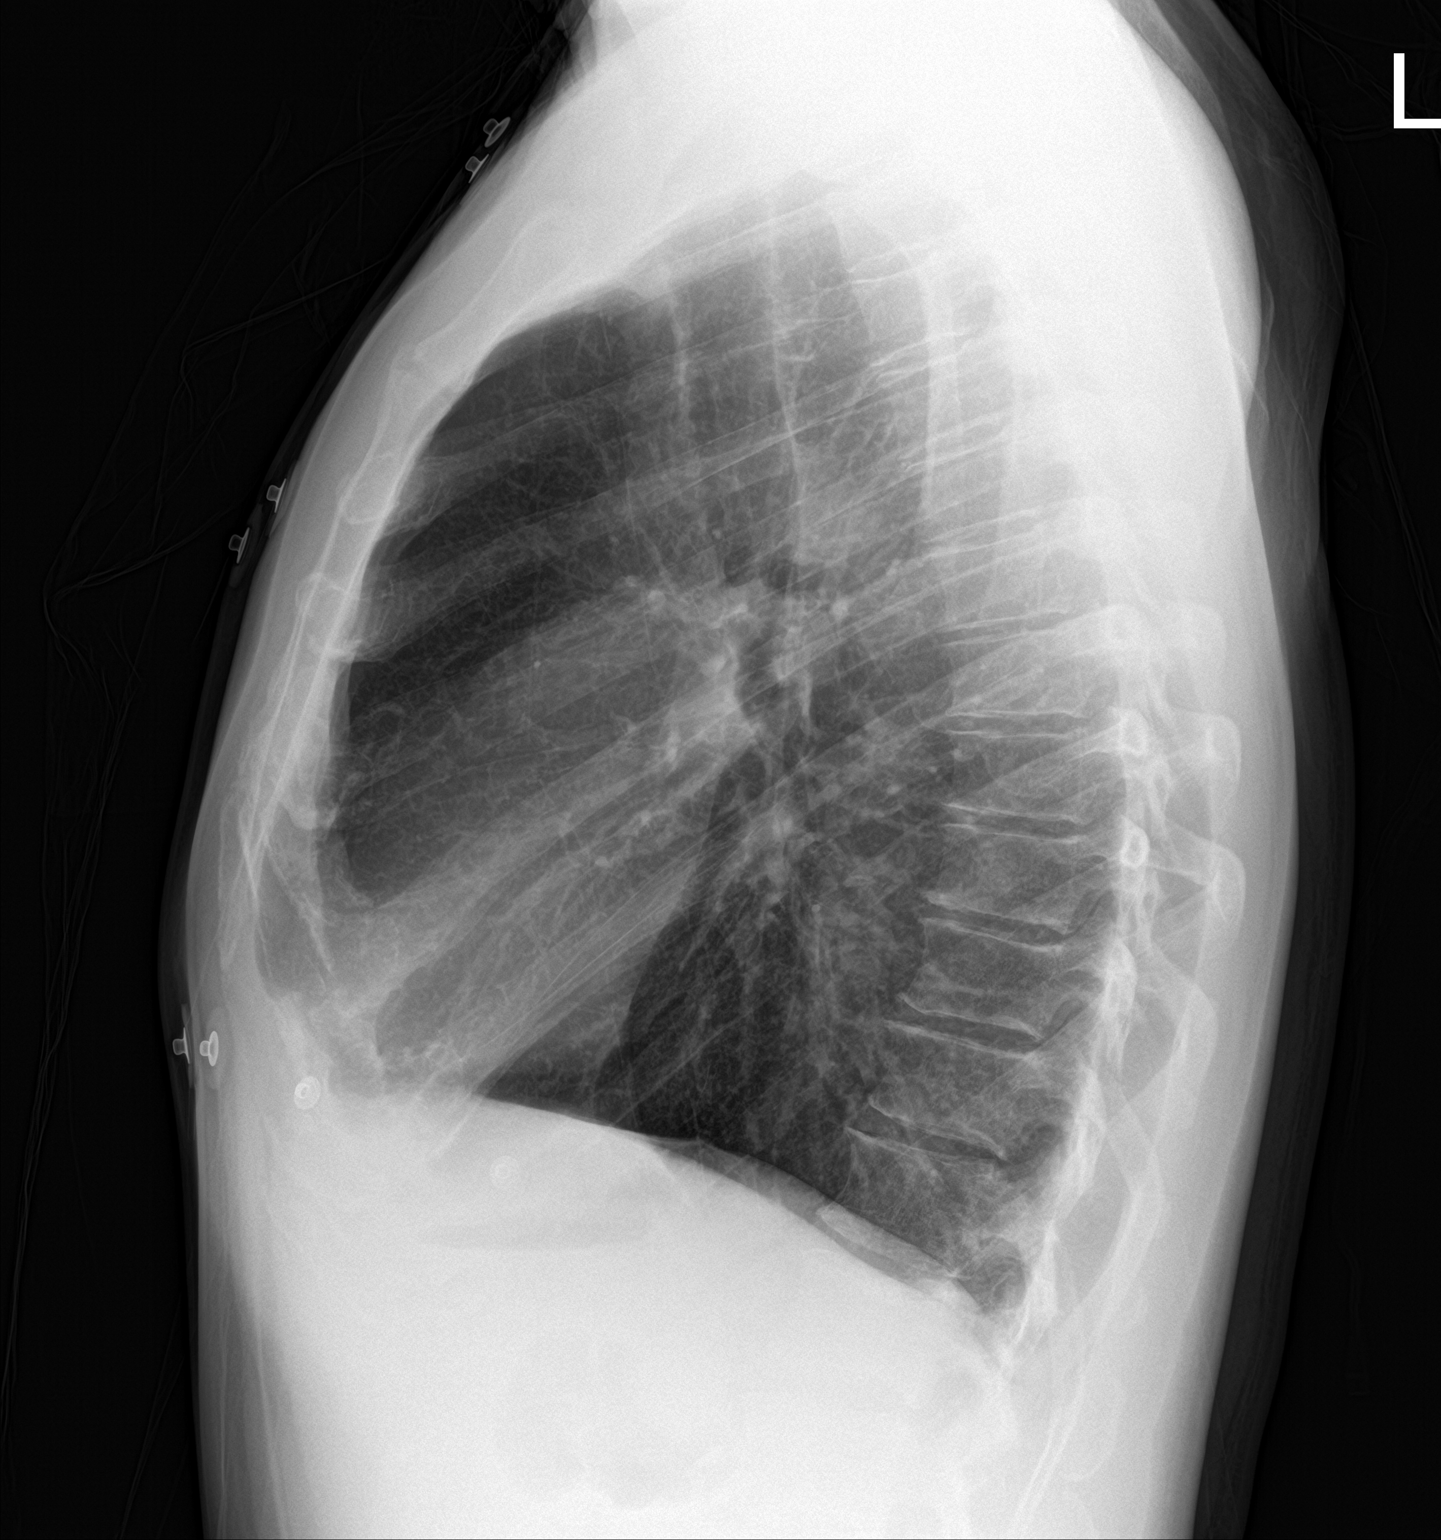

[2 of 2 positions shown; findings below may reference images not displayed]

FINDINGS: Heart size within normal limits. Mild linear atelectasis versus
scarring within the left lung base. No appreciable airspace
consolidation. No evidence of pleural effusion or pneumothorax. No
acute bony abnormality identified.
IMPRESSION: Mild linear atelectasis versus scarring within the left lung base.

Otherwise, no evidence of acute cardiopulmonary abnormality.

## 2022-09-22 ENCOUNTER — Encounter: Payer: Self-pay | Admitting: Nurse Practitioner

## 2022-09-22 ENCOUNTER — Ambulatory Visit (INDEPENDENT_AMBULATORY_CARE_PROVIDER_SITE_OTHER): Payer: Self-pay | Admitting: Nurse Practitioner

## 2022-09-22 VITALS — BP 124/83 | HR 98 | Temp 98.2°F | Ht 68.0 in | Wt 186.0 lb

## 2022-09-22 DIAGNOSIS — R1031 Right lower quadrant pain: Secondary | ICD-10-CM

## 2022-09-22 DIAGNOSIS — D229 Melanocytic nevi, unspecified: Secondary | ICD-10-CM

## 2022-09-22 DIAGNOSIS — R109 Unspecified abdominal pain: Secondary | ICD-10-CM

## 2022-09-22 NOTE — Assessment & Plan Note (Signed)
-   Ambulatory referral to Dermatology  2. Right lower quadrant pain  - US Abdomen Limited   Follow up:  Follow up in 3 months - physical

## 2022-09-22 NOTE — Patient Instructions (Signed)
1. Atypical mole  - Ambulatory referral to Dermatology  2. Right lower quadrant pain  - US Abdomen Limited   Follow up:  Follow up in 3 months - physical
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
## 2022-10-04 ENCOUNTER — Inpatient Hospital Stay: Admission: RE | Admit: 2022-10-04 | Payer: Self-pay | Source: Ambulatory Visit

## 2022-10-04 NOTE — Addendum Note (Signed)
Addended by: Merrilyn Puma on: 10/04/2022 11:30 AM   Modules accepted: Orders

## 2022-10-06 ENCOUNTER — Ambulatory Visit (INDEPENDENT_AMBULATORY_CARE_PROVIDER_SITE_OTHER): Payer: Self-pay | Admitting: Clinical

## 2022-10-06 DIAGNOSIS — Z599 Problem related to housing and economic circumstances, unspecified: Secondary | ICD-10-CM

## 2022-10-06 NOTE — BH Specialist Note (Signed)
Integrated Behavioral Health Referral Note  10/06/2022 Name: Wyatt Collins MRN: [id number] DOB: [birth year] [name] is a 52 y.o. year old male who sees Ivonne Andrew, NP for primary care. LCSW was consulted to assess patient's needs and assist the patient with Financial Difficulties related to no income and no health coverage.  **No billable visit. Patient was scheduled as a behavioral health consult but only needs financial assistance.**  Interpreter: No.   Interpreter Name & Language: none  Assessment: Patient experiencing Financial Difficulties related to no income and no health coverage.  Intervention: Patient referred to CSW for financial difficulties. He is living with his mom and looking for work. He does not have health insurance.   Referred patient to the Medicaid workers at [facility]. Advised that he check with them on his eligibility for Medicaid, and if not eligible for some reason, he could also apply for [employer].   SDOH (Social Determinants of Health) assessments performed: No   Review of patient status, including review of consultants reports, relevant laboratory and other test results, and collaboration with appropriate care team members and the patient's provider was performed as part of comprehensive patient evaluation and provision of services.    Abigail Butts, LCSW [facility] [phone]

## 2022-10-07 ENCOUNTER — Ambulatory Visit
Admission: RE | Admit: 2022-10-07 | Discharge: 2022-10-07 | Disposition: A | Discharge status: Home / Self Care | Payer: Self-pay | Source: Ambulatory Visit | Attending: Nurse Practitioner | Admitting: Nurse Practitioner

## 2022-10-07 ENCOUNTER — Other Ambulatory Visit: Payer: Self-pay

## 2022-10-07 ENCOUNTER — Other Ambulatory Visit: Payer: Self-pay | Admitting: Nurse Practitioner

## 2022-10-07 DIAGNOSIS — K760 Fatty (change of) liver, not elsewhere classified: Secondary | ICD-10-CM

## 2022-10-12 ENCOUNTER — Encounter: Payer: Self-pay | Admitting: Internal Medicine

## 2022-10-26 ENCOUNTER — Ambulatory Visit: Payer: Self-pay | Admitting: Dermatology

## 2022-11-24 ENCOUNTER — Ambulatory Visit: Payer: Self-pay | Admitting: Dermatology

## 2022-12-24 ENCOUNTER — Ambulatory Visit: Payer: Self-pay | Admitting: Nurse Practitioner

## 2022-12-29 ENCOUNTER — Ambulatory Visit: Payer: Self-pay | Admitting: Nurse Practitioner

## 2022-12-29 ENCOUNTER — Encounter: Payer: Self-pay | Admitting: Dermatology

## 2022-12-29 ENCOUNTER — Ambulatory Visit (INDEPENDENT_AMBULATORY_CARE_PROVIDER_SITE_OTHER): Payer: Self-pay | Admitting: Dermatology

## 2022-12-29 VITALS — BP 132/76 | HR 100

## 2022-12-29 DIAGNOSIS — D485 Neoplasm of uncertain behavior of skin: Secondary | ICD-10-CM

## 2022-12-29 DIAGNOSIS — D492 Neoplasm of unspecified behavior of bone, soft tissue, and skin: Secondary | ICD-10-CM

## 2022-12-29 DIAGNOSIS — D2272 Melanocytic nevi of left lower limb, including hip: Secondary | ICD-10-CM

## 2022-12-29 NOTE — Patient Instructions (Addendum)

## 2022-12-29 NOTE — Progress Notes (Signed)
   New Patient Visit   Subjective  Wyatt Collins is a 52 y.o. male who presents for the following: growth on left leg Pt has growth to be evaluated. Lesion has been present for about a year and growing in size. He has not had this spot treated previously. He has no hx of skin cancer nor hx of skin cancer  The following portions of the chart were reviewed this encounter and updated as appropriate: medications, allergies, medical history  Review of Systems:  No other skin or systemic complaints except as noted in HPI or Assessment and Plan.  Objective  Well appearing patient in no apparent distress; mood and affect are within normal limits.  A focused examination was performed of the following areas: Left leg  Relevant exam findings are noted in the Assessment and Plan.  left thigh Dark brown macule    Assessment & Plan    Neoplasm of uncertain behavior of skin left thigh  Skin / nail biopsy Type of biopsy: tangential   Informed consent: discussed and consent obtained   Timeout: patient name, date of birth, surgical site, and procedure verified   Anesthesia: the lesion was anesthetized in a standard fashion   Anesthetic:  1% lidocaine w/ epinephrine 1-100,000 buffered w/ 8.4% NaHCO3 Instrument used: DermaBlade   Hemostasis achieved with: aluminum chloride   Outcome: patient tolerated procedure well   Post-procedure details: sterile dressing applied and wound care instructions given   Dressing type: bandage and petrolatum      Return if symptoms worsen or fail to improve.  I, Tillie Fantasia, CMA, am acting as scribe for Gwenith Daily, MD.   Documentation: I have reviewed the above documentation for accuracy and completeness, and I agree with the above.  Gwenith Daily, MD

## 2022-12-31 LAB — SURGICAL PATHOLOGY

## 2023-01-04 ENCOUNTER — Ambulatory Visit: Payer: Self-pay | Admitting: Internal Medicine

## 2023-01-10 ENCOUNTER — Telehealth: Payer: Self-pay

## 2023-01-10 NOTE — Telephone Encounter (Signed)
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

## 2023-01-10 NOTE — Telephone Encounter (Signed)
Spoke w pt gave him results and recommendations

## 2023-01-12 ENCOUNTER — Ambulatory Visit: Payer: Self-pay | Admitting: Nurse Practitioner

## 2023-01-13 ENCOUNTER — Ambulatory Visit: Payer: Self-pay | Admitting: Nurse Practitioner

## 2023-01-14 ENCOUNTER — Ambulatory Visit: Payer: Self-pay | Admitting: Nurse Practitioner

## 2023-02-02 ENCOUNTER — Encounter: Payer: Self-pay | Admitting: Dermatology

## 2023-02-03 ENCOUNTER — Ambulatory Visit: Payer: Self-pay | Admitting: Dermatology

## 2023-02-03 NOTE — Patient Instructions (Signed)
Wound Care Instructions for After Surgery  On the day following your surgery, you should begin doing daily dressing changes until your sutures are removed:  Remove the bandage.  Cleanse the wound gently with soap and water.   Make sure you then dry the skin surrounding the wound completely or the tape will not stick to the skin. Do not use cotton balls on the wound.  After the wound is clean and dry, apply the ointment (either prescription antibiotic prescribed by your doctor or plain Vaseline if nothing was prescribed) gently with a Q-tip.  If you are using a bandaid to cover:  Apply a bandaid large enough to cover the entire wound.  If you do not have a bandaid large enough to cover the wound OR if you are sensitive to bandaid adhesive:  Cut a non-stick pad (such as Telfa) to fit the size of the wound.   Cover the wound with the non-stick pad.  If the wound is draining, you may want to add a small amount of gauze on top of the non-stick pad for a little added compression to the area.  Use tape to seal the area completely.  For the next 1-2 weeks:  Be sure to keep the wound moist with ointment 24/7 to ensure best healing. If you are unable to cover the wound with a bandage to hold the ointment in place, you may need to reapply the ointment several times a day.  Do not bend over or lift heavy items to reduce the chance of elevated blood pressure to the wound.  Do not participate in particularly strenuous activities.  Below is a list of dressing supplies you might need.   Cotton-tipped applicators - Q-tips  Gauze pads (2x2 and/or 4x4) - All-Purpose Sponges  New and clean tube of petroleum jelly (Vaseline) OR prescription antibiotic ointment if prescribed  Either a bandaid large enough to cover the entire wound OR non-stick dressing material (Telfa) and Tape (Paper or Hypafix)  FOR ADULT SURGERY PATIENTS: If you need something for pain relief, you may take 1 extra strength  Tylenol (acetaminophen) and 2 ibuprofen (200 mg) together every 4 hours as needed. (Do not take these medications if you are allergic to them or if you know you cannot take them for any other reason). Typically you may only need pain medication for 1-3 days.   Comments on the Post-Operative Period Slight swelling and redness often appear around the wound. This is normal and will disappear within several days following the surgery. The healing wound will drain a brownish-red-yellow discharge during healing. This is a normal phase of wound healing. As the wound begins to heal, the drainage may increase in amount. Again, this drainage is normal. Notify us if the drainage becomes persistently bloody, excessively swollen, or intensely painful or develops a foul odor or red streaks.  The healing wound will also typically be itchy. This is normal. If you have severe or persistent pain, Notify us if the discomfort is severe or persistent. Avoid alcoholic beverages when taking pain medicine.  In Case of Wound Hemorrhage A wound hemorrhage is when the bandage suddenly becomes soaked with bright red blood and flows profusely. If this happens, sit down or lie down with your head elevated. If the wound has a dressing on it, do not remove the dressing. Apply pressure to the existing gauze. If the wound is not covered, use a gauze pad to apply pressure and continue applying the pressure for 20 minutes without   peeking. DO NOT COVER THE WOUND WITH A LARGE TOWEL OR WASH CLOTH. Release your hand from the wound site but do not remove the dressing. If the bleeding has stopped, gently clean around the wound. Leave the dressing in place for 24 hours if possible. This wait time allows the blood vessels to close off so that you do not spark a new round of bleeding by disrupting the newly clotted blood vessels with an immediate dressing change. If the bleeding does not subside, continue to hold pressure for 40 minutes. If bleeding  continues, page your physician, contact an After Hours clinic or go to the Emergency Room. ?  

## 2023-02-03 NOTE — Progress Notes (Deleted)
   Follow-Up Visit   Subjective  Wyatt Collins is a 53 y.o. 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  The following portions of the chart were reviewed this encounter and updated as appropriate: medications, allergies, medical history  Review of Systems:  No other skin or systemic complaints except as noted in HPI or Assessment and Plan.  Objective  Well appearing patient in no apparent distress; mood and affect are within normal limits.  A focused examination was performed of the following areas:  Left thigh  Relevant physical exam findings are noted in the Assessment and Plan.     Assessment & Plan      No follow-ups on file.  LILLETTE Berwyn Baseman, Surg Tech III, am acting as scribe for RUFUS CHRISTELLA HOLY, MD.   Documentation: I have reviewed the above documentation for accuracy and completeness, and I agree with the above.  RUFUS CHRISTELLA HOLY, MD

## 2023-03-03 ENCOUNTER — Telehealth: Payer: Self-pay | Admitting: Dermatology

## 2023-04-27 NOTE — Telephone Encounter (Signed)
 Left voicemail for scheduling

## 2023-08-12 ENCOUNTER — Encounter: Payer: Self-pay | Admitting: Advanced Practice Midwife

## 2023-09-12 ENCOUNTER — Encounter (HOSPITAL_COMMUNITY): Payer: Self-pay

## 2023-09-12 ENCOUNTER — Emergency Department (HOSPITAL_COMMUNITY)
Admission: EM | Admit: 2023-09-12 | Discharge: 2023-09-12 | Discharge status: Against Medical Advice | Payer: Self-pay | Attending: Emergency Medicine | Admitting: Emergency Medicine

## 2023-09-12 ENCOUNTER — Other Ambulatory Visit: Payer: Self-pay

## 2023-09-12 DIAGNOSIS — Z5321 Procedure and treatment not carried out due to patient leaving prior to being seen by health care provider: Secondary | ICD-10-CM | POA: Insufficient documentation

## 2023-09-12 DIAGNOSIS — R058 Other specified cough: Secondary | ICD-10-CM | POA: Insufficient documentation

## 2023-09-12 LAB — RESP PANEL BY RT-PCR (RSV, FLU A&B, COVID)  RVPGX2
Influenza A by PCR: NEGATIVE
Influenza B by PCR: NEGATIVE
Resp Syncytial Virus by PCR: NEGATIVE
SARS Coronavirus 2 by RT PCR: NEGATIVE

## 2023-09-12 NOTE — ED Triage Notes (Addendum)
 Pt c.o productive cough for 3 weeks. Pt hysterically crying in triage saying my mom died yesterday

## 2023-09-12 NOTE — ED Notes (Signed)
 Pt irritable, yelling and cursing and staff. States he is leaving.

## 2023-09-13 ENCOUNTER — Telehealth: Payer: Self-pay

## 2023-09-13 NOTE — Transitions of Care (Post Inpatient/ED Visit) (Signed)
   09/13/2023  Name: Wyatt Collins MRN: [id number] DOB: December 11, 1970  Today's TOC FU Call Status: Today's TOC FU Call Status:: Unsuccessful Call (1st Attempt) Unsuccessful Call (1st Attempt) Date: 09/13/23  Attempted to reach the patient regarding the most recent Inpatient/ED visit.  Follow Up Plan: Additional outreach attempts will be made to reach the patient to complete the Transitions of Care (Post Inpatient/ED visit) call.   Signature  [employer], [place]

## 2023-09-15 ENCOUNTER — Encounter (HOSPITAL_COMMUNITY): Payer: Self-pay

## 2023-09-15 ENCOUNTER — Emergency Department (HOSPITAL_COMMUNITY)
Admission: EM | Admit: 2023-09-15 | Discharge: 2023-09-15 | Discharge status: Against Medical Advice | Payer: Self-pay | Attending: Emergency Medicine | Admitting: Emergency Medicine

## 2023-09-15 ENCOUNTER — Other Ambulatory Visit: Payer: Self-pay

## 2023-09-15 DIAGNOSIS — F1010 Alcohol abuse, uncomplicated: Secondary | ICD-10-CM | POA: Insufficient documentation

## 2023-09-15 DIAGNOSIS — R059 Cough, unspecified: Secondary | ICD-10-CM | POA: Insufficient documentation

## 2023-09-15 DIAGNOSIS — K921 Melena: Secondary | ICD-10-CM | POA: Insufficient documentation

## 2023-09-15 DIAGNOSIS — Z5321 Procedure and treatment not carried out due to patient leaving prior to being seen by health care provider: Secondary | ICD-10-CM | POA: Insufficient documentation

## 2023-09-15 DIAGNOSIS — F419 Anxiety disorder, unspecified: Secondary | ICD-10-CM | POA: Insufficient documentation

## 2023-09-15 NOTE — ED Triage Notes (Addendum)
 Patient BIB [facility] from a friends house. Patient has anxiety and stated his mom passed away a week ago. Denies SI/HI. Patient said he has had 5 days of bright red bloody stools, cough for 3 weeks, anxiety, alcohol withdrawals (took 2 shots of rum 45 minutes ago). Patient tearful in triage, upset.

## 2023-09-15 NOTE — ED Notes (Signed)
 RN tried to get Blood work for patient. Patient stated I dont want to waste your time and left the department.

## 2023-09-17 ENCOUNTER — Emergency Department (HOSPITAL_COMMUNITY): Payer: Self-pay

## 2023-09-17 ENCOUNTER — Encounter (HOSPITAL_COMMUNITY): Payer: Self-pay

## 2023-09-17 ENCOUNTER — Emergency Department (HOSPITAL_COMMUNITY)
Admission: EM | Admit: 2023-09-17 | Discharge: 2023-09-17 | Discharge status: Against Medical Advice | Payer: Self-pay | Attending: Emergency Medicine | Admitting: Emergency Medicine

## 2023-09-17 DIAGNOSIS — R0602 Shortness of breath: Secondary | ICD-10-CM | POA: Insufficient documentation

## 2023-09-17 DIAGNOSIS — E876 Hypokalemia: Secondary | ICD-10-CM | POA: Insufficient documentation

## 2023-09-17 DIAGNOSIS — R051 Acute cough: Secondary | ICD-10-CM | POA: Insufficient documentation

## 2023-09-17 DIAGNOSIS — R109 Unspecified abdominal pain: Secondary | ICD-10-CM | POA: Insufficient documentation

## 2023-09-17 LAB — CBC WITH DIFFERENTIAL/PLATELET
Abs Immature Granulocytes: 0.07 K/uL (ref 0.00–0.07)
Basophils Absolute: 0.1 K/uL (ref 0.0–0.1)
Basophils Relative: 1 %
Eosinophils Absolute: 0 K/uL (ref 0.0–0.5)
Eosinophils Relative: 0 %
HCT: 44.9 % (ref 39.0–52.0)
Hemoglobin: 16 g/dL (ref 13.0–17.0)
Immature Granulocytes: 1 %
Lymphocytes Relative: 37 %
Lymphs Abs: 2.8 K/uL (ref 0.7–4.0)
MCH: 36.6 pg — ABNORMAL HIGH (ref 26.0–34.0)
MCHC: 35.6 g/dL (ref 30.0–36.0)
MCV: 102.7 fL — ABNORMAL HIGH (ref 80.0–100.0)
Monocytes Absolute: 0.7 K/uL (ref 0.1–1.0)
Monocytes Relative: 9 %
Neutro Abs: 4 K/uL (ref 1.7–7.7)
Neutrophils Relative %: 52 %
Platelets: 217 K/uL (ref 150–400)
RBC: 4.37 MIL/uL (ref 4.22–5.81)
RDW: 13.1 % (ref 11.5–15.5)
WBC: 7.7 K/uL (ref 4.0–10.5)
nRBC: 0 % (ref 0.0–0.2)

## 2023-09-17 LAB — COMPREHENSIVE METABOLIC PANEL WITH GFR
ALT: 120 U/L — ABNORMAL HIGH (ref 0–44)
AST: 177 U/L — ABNORMAL HIGH (ref 15–41)
Albumin: 3.4 g/dL — ABNORMAL LOW (ref 3.5–5.0)
Alkaline Phosphatase: 82 U/L (ref 38–126)
Anion gap: 14 (ref 5–15)
BUN: 6 mg/dL (ref 6–20)
CO2: 25 mmol/L (ref 22–32)
Calcium: 9.1 mg/dL (ref 8.9–10.3)
Chloride: 99 mmol/L (ref 98–111)
Creatinine, Ser: 0.95 mg/dL (ref 0.61–1.24)
GFR, Estimated: >60 mL/min (ref >60)
Glucose, Bld: 105 mg/dL — ABNORMAL HIGH (ref 70–99)
Potassium: 3.4 mmol/L — ABNORMAL LOW (ref 3.5–5.1)
Sodium: 138 mmol/L (ref 135–145)
Total Bilirubin: 0.7 mg/dL (ref 0.0–1.2)
Total Protein: 7.3 g/dL (ref 6.5–8.1)

## 2023-09-17 LAB — RESP PANEL BY RT-PCR (RSV, FLU A&B, COVID)  RVPGX2
Influenza A by PCR: NEGATIVE
Influenza B by PCR: NEGATIVE
Resp Syncytial Virus by PCR: NEGATIVE
SARS Coronavirus 2 by RT PCR: NEGATIVE

## 2023-09-17 LAB — LIPASE, BLOOD: Lipase: 65 U/L — ABNORMAL HIGH (ref 11–51)

## 2023-09-17 LAB — TROPONIN I (HIGH SENSITIVITY): Troponin I (High Sensitivity): 23 ng/L — ABNORMAL HIGH (ref <18)

## 2023-09-17 MED ORDER — IPRATROPIUM-ALBUTEROL 0.5-2.5 (3) MG/3ML IN SOLN
3.0000 mL | Freq: Once | RESPIRATORY_TRACT | Status: AC
  Administered 2023-09-17: 3 mL via RESPIRATORY_TRACT
  Filled 2023-09-17: qty 3

## 2023-09-17 MED ORDER — PREDNISONE 20 MG PO TABS
60.0000 mg | ORAL_TABLET | Freq: Once | ORAL | Status: AC
  Administered 2023-09-17: 60 mg via ORAL
  Filled 2023-09-17: qty 3

## 2023-09-17 NOTE — ED Provider Notes (Signed)
 [place] EMERGENC[facility] DEPARTMENT AT [facility] Provider Note   CSN: [id number] Arrival date & time: 09/17/23  1111     Patient presents with: Shortness of Breath, Anxiety, and GI Bleeding   Wyatt Collins is a 53 y.o. male with a past medical history significant for depression, alcohol abuse, cocaine abuse, and emphysema who presents to the ED with multiple complaints.  Patient states he has been short of breath and coughing for the past 3 weeks.  No chest pain.  Denies fever and chills.  Patient has been around numerous sick contacts. Denies history of blood clots.   He also admits to some upper abdominal pain and rectal bleeding for the past 4 days.  Admits to bright red blood per rectum.  History of alcohol abuse.  Typically drinks half a fifth of liquor daily. Drank earlier today.   Patient is a poor historian, difficult to obtain HPI. He just keeps repeating he needs a breathing treatment during initial evaluation. Level 5 caveat.   History obtained from patient and past medical records. No interpreter used during encounter.      Prior to Admission medications   Medication Sig Start Date End Date Taking? Authorizing Provider  cyclobenzaprine  (FLEXERIL ) 10 MG tablet Take 1 tablet (10 mg total) by mouth 2 (two) times daily as needed for muscle spasms. Patient not taking: Reported on 04/23/2020 08/02/18   Nivia Colon, PA-C  diclofenac  sodium (VOLTAREN ) 1 % GEL Apply 2 g topically 4 (four) times daily. Patient not taking: Reported on 04/23/2020 08/02/18   Nivia Colon, PA-C  dicyclomine  (BENTYL ) 20 MG tablet Take 1 tablet (20 mg total) by mouth 2 (two) times daily. Patient not taking: Reported on 05/08/2020 04/23/20   Waylan Elsie PARAS, PA-C  famotidine  (PEPCID ) 20 MG tablet Take 20 mg by mouth daily. Patient not taking: Reported on 09/22/2022 07/06/19   [provider]  FLUoxetine  (PROZAC ) 20 MG capsule Take 1 capsule (20 mg total) by mouth daily. Patient not taking:  Reported on 09/22/2022 05/08/20   Mayers, Kirk GORMAN, PA-C  traZODone  (DESYREL ) 150 MG tablet Take 1 tablet (150 mg total) by mouth at bedtime. Patient not taking: Reported on 09/22/2022 05/08/20   Mayers, Cari S, PA-C    Allergies: Desipramine, Ibuprofen , and Tramadol    Review of Systems  Constitutional:  Negative for chills and fever.  Respiratory:  Positive for cough and shortness of breath.   Cardiovascular:  Negative for chest pain and leg swelling.  Gastrointestinal:  Positive for abdominal pain and blood in stool. Negative for diarrhea, nausea and vomiting.    Updated Vital Signs BP (!) 140/97 (BP Location: Right Arm)   Pulse (!) 112   Temp 98.2 F (36.8 C) (Oral)   Resp (!) 22   Ht 5' 8 (1.727 m)   Wt 84.8 kg   SpO2 94%   BMI 28.43 kg/m   Physical Exam Vitals and nursing note reviewed.  Constitutional:      General: He is not in acute distress.    Appearance: He is not ill-appearing.  HENT:     Head: Normocephalic.  Eyes:     Pupils: Pupils are equal, round, and reactive to light.  Cardiovascular:     Rate and Rhythm: Normal rate and regular rhythm.     Pulses: Normal pulses.     Heart sounds: Normal heart sounds. No murmur heard.    No friction rub. No gallop.  Pulmonary:     Effort: Pulmonary effort  is normal.     Breath sounds: Wheezing present.  Abdominal:     General: Abdomen is flat. There is no distension.     Palpations: Abdomen is soft.     Tenderness: There is no abdominal tenderness. There is no guarding or rebound.  Musculoskeletal:        General: Normal range of motion.     Cervical back: Neck supple.  Skin:    General: Skin is warm and dry.  Neurological:     General: No focal deficit present.     Mental Status: He is alert.  Psychiatric:        Mood and Affect: Mood normal.        Behavior: Behavior normal.     (all labs ordered are listed, but only abnormal results are displayed) Labs Reviewed  CBC WITH DIFFERENTIAL/PLATELET -  Abnormal; Notable for the following components:      Result Value   MCV 102.7 (*)    MCH 36.6 (*)    All other components within normal limits  COMPREHENSIVE METABOLIC PANEL WITH GFR - Abnormal; Notable for the following components:   Potassium 3.4 (*)    Glucose, Bld 105 (*)    Albumin 3.4 (*)    AST 177 (*)    ALT 120 (*)    All other components within normal limits  LIPASE, BLOOD - Abnormal; Notable for the following components:   Lipase 65 (*)    All other components within normal limits  TROPONIN I (HIGH SENSITIVITY) - Abnormal; Notable for the following components:   Troponin I (High Sensitivity) 23 (*)    All other components within normal limits  RESP PANEL BY RT-PCR (RSV, FLU A&B, COVID)  [id number]  URINALYSIS, ROUTINE W REFLEX MICROSCOPIC  POC OCCULT BLOOD, ED  TROPONIN I (HIGH SENSITIVITY)    EKG: None  Radiology: DG Chest 1 View Result Date: 09/17/2023 CLINICAL DATA:  Shortness of breath. EXAM: CHEST  1 VIEW COMPARISON:  Chest radiograph dated 03/21/2022. FINDINGS: The heart size and mediastinal contours are within normal limits. Both lungs are clear. The visualized skeletal structures are unremarkable. IMPRESSION: No active disease. Electronically Signed   By: Vanetta Chou M.D.   On: 09/17/2023 12:17     Procedures   Medications Ordered in the ED  ipratropium-albuterol  (DUONEB) 0.5-2.5 (3) MG/3ML nebulizer solution 3 mL (3 mLs Nebulization Given 09/17/23 1226)  predniSONE  (DELTASONE ) tablet 60 mg (60 mg Oral Given 09/17/23 1226)    Clinical Course as of 09/17/23 1529  Sat Sep 17, 2023  1245 Attempted rectal exam however, patient declined. [CA]    Clinical Course User Index [CA] Lorelle Aleck BROCKS, PA-C                                 Medical Decision Making Amount and/or Complexity of Data Reviewed External Data Reviewed: notes. Labs: ordered. Decision-making details documented in ED Course. Radiology: ordered and independent interpretation performed.  Decision-making details documented in ED Course.  Risk Prescription drug management.   This patient presents to the ED for concern of SOB, this involves an extensive number of treatment options, and is a complaint that carries with it a high risk of complications and morbidity.  The differential diagnosis includes PNA, broncitis, ACS, PE, etc  53 year old male presents to the ED due to shortness of breath, cough, rectal bleeding, and abdominal pain. Drinks half a fifth daily. Upon arrival patient  tachycardic at 112 and tachypneic.  On exam patient agitated and angry with nursing staff.  Patient became slightly aggressive.  Security called to bedside.  Patient does have slight wheeze on exam. Duoneb treatment given.  Routine labs ordered.  Will perform rectal exam given rectal bleeding.  Attempted to perform rectal exam, but patient declined.  1:23 PM informed by RN that patient eloped from the emergency department.  Attempted to call patient given his abnormal labs.  Phone went straight to voicemail.  CBC with no leukocytosis.  Normal hemoglobin.  CMP with transaminitis AST 177, ALT 120.  Normal total bilirubin.  Lipase mildly elevated at 65. CXR negative.      Final diagnoses:  Shortness of breath  Acute cough    ED Discharge Orders     None          Lorelle Aleck BROCKS, PA-C 09/17/23 1529    Bari Roxie HERO, DO 09/23/23 1612

## 2023-09-17 NOTE — ED Notes (Signed)
Patient seen walking out of ED 

## 2023-09-17 NOTE — ED Notes (Signed)
 Patient eloped, sort NT stated patient left cursing and yelling stating that he would be better off at [facility]

## 2023-09-17 NOTE — ED Notes (Signed)
 Patient refusing lab draws, cursing and yelling at staff. Patient stated you're not getting any fucking thing from me.

## 2023-09-17 NOTE — ED Notes (Signed)
 Patient states, I don't want anybody touching my butt hole Patient refused rectal exam and occult blood specimen. PA in room during refusal.

## 2023-09-17 NOTE — ED Triage Notes (Signed)
 Per EMS, Pt, from home, c/o anxiety since mother passes (either 1 week ago or 2-3 days ago) and SOB and cough x3 weeks.  Pt easily speaking full sentences.   Pt told EMS he has stopped using drugs, but has been drinking daily.   Upon assessment, Pt c/o upper abdominal pain and bright red rectal bleeding w/ bms x4 days.  Pain score 7/10.  Pt reports drinking vodka this morning.   Pt went to [facility] yesterday for same but left because he thought they were going to IVC him.  Pt denies SI/HI.

## 2023-09-17 NOTE — ED Notes (Signed)
 Pt noted to be screaming that he wants a breathing treatment.  Pt informed we are waiting for him to be seen by an EDP.  Pt sts so I have to die to get a breathing treatment.  Pt informed that his lungs sound clear and is vitals are good, so we don't believe he is dying.  Pt sts I don't give a f*ck what my vitals are.  I know, how my lungs feel.  Pt sts maybe I should walk out of here.  After this writer walked off, Pt continued mumble and curse.  NAD noted.  Pt easily speaking full sentences.

## 2023-09-20 ENCOUNTER — Telehealth: Payer: Self-pay

## 2023-09-20 NOTE — Transitions of Care (Post Inpatient/ED Visit) (Signed)
   09/20/2023  Name: Wyatt Collins MRN: [id number] DOB: 1970/12/09  Today's TOC FU Call Status: Today's TOC FU Call Status:: Unsuccessful Call (2nd Attempt) Unsuccessful Call (2nd Attempt) Date: 09/20/23  Attempted to reach the patient regarding the most recent Inpatient/ED visit.  Follow Up Plan: Additional outreach attempts will be made to reach the patient to complete the Transitions of Care (Post Inpatient/ED visit) call.   Signature  [employer], [place]

## 2023-10-12 ENCOUNTER — Emergency Department (HOSPITAL_COMMUNITY)
Admission: EM | Admit: 2023-10-12 | Discharge: 2023-10-12 | Disposition: A | Discharge status: Home / Self Care | Payer: Self-pay

## 2023-10-12 NOTE — ED Notes (Signed)
 Pt left ED and pt taken OTF.

## 2023-10-12 NOTE — ED Triage Notes (Signed)
 No asnwer x3

## 2023-10-12 NOTE — ED Triage Notes (Signed)
Pt did not answer x 2 

## 2023-10-14 ENCOUNTER — Emergency Department (HOSPITAL_COMMUNITY)
Admission: EM | Admit: 2023-10-14 | Discharge: 2023-10-14 | Disposition: A | Discharge status: Home / Self Care | Payer: Self-pay | Attending: Emergency Medicine | Admitting: Emergency Medicine

## 2023-10-14 ENCOUNTER — Other Ambulatory Visit: Payer: Self-pay

## 2023-10-14 ENCOUNTER — Encounter (HOSPITAL_COMMUNITY): Payer: Self-pay

## 2023-10-14 DIAGNOSIS — K029 Dental caries, unspecified: Secondary | ICD-10-CM | POA: Insufficient documentation

## 2023-10-14 DIAGNOSIS — K047 Periapical abscess without sinus: Secondary | ICD-10-CM | POA: Insufficient documentation

## 2023-10-14 DIAGNOSIS — R7401 Elevation of levels of liver transaminase levels: Secondary | ICD-10-CM | POA: Insufficient documentation

## 2023-10-14 LAB — COMPREHENSIVE METABOLIC PANEL WITH GFR
ALT: 72 U/L — ABNORMAL HIGH (ref 0–44)
AST: 119 U/L — ABNORMAL HIGH (ref 15–41)
Albumin: 3.7 g/dL (ref 3.5–5.0)
Alkaline Phosphatase: 70 U/L (ref 38–126)
Anion gap: 15 (ref 5–15)
BUN: 7 mg/dL (ref 6–20)
CO2: 23 mmol/L (ref 22–32)
Calcium: 8.7 mg/dL — ABNORMAL LOW (ref 8.9–10.3)
Chloride: 101 mmol/L (ref 98–111)
Creatinine, Ser: 0.9 mg/dL (ref 0.61–1.24)
GFR, Estimated: >60 mL/min (ref >60)
Glucose, Bld: 95 mg/dL (ref 70–99)
Potassium: 3.3 mmol/L — ABNORMAL LOW (ref 3.5–5.1)
Sodium: 139 mmol/L (ref 135–145)
Total Bilirubin: 0.8 mg/dL (ref 0.0–1.2)
Total Protein: 7.4 g/dL (ref 6.5–8.1)

## 2023-10-14 LAB — CBC WITH DIFFERENTIAL/PLATELET
Abs Immature Granulocytes: 0.03 K/uL (ref 0.00–0.07)
Basophils Absolute: 0.1 K/uL (ref 0.0–0.1)
Basophils Relative: 1 %
Eosinophils Absolute: 0.1 K/uL (ref 0.0–0.5)
Eosinophils Relative: 1 %
HCT: 45 % (ref 39.0–52.0)
Hemoglobin: 16 g/dL (ref 13.0–17.0)
Immature Granulocytes: 0 %
Lymphocytes Relative: 44 %
Lymphs Abs: 3.1 K/uL (ref 0.7–4.0)
MCH: 37.6 pg — ABNORMAL HIGH (ref 26.0–34.0)
MCHC: 35.6 g/dL (ref 30.0–36.0)
MCV: 105.6 fL — ABNORMAL HIGH (ref 80.0–100.0)
Monocytes Absolute: 0.7 K/uL (ref 0.1–1.0)
Monocytes Relative: 11 %
Neutro Abs: 2.9 K/uL (ref 1.7–7.7)
Neutrophils Relative %: 43 %
Platelets: 211 K/uL (ref 150–400)
RBC: 4.26 MIL/uL (ref 4.22–5.81)
RDW: 14.5 % (ref 11.5–15.5)
WBC: 6.9 K/uL (ref 4.0–10.5)
nRBC: 0 % (ref 0.0–0.2)

## 2023-10-14 LAB — I-STAT CG4 LACTIC ACID, ED: Lactic Acid, Venous: 1.7 mmol/L (ref 0.5–1.9)

## 2023-10-14 MED ORDER — AMOXICILLIN-POT CLAVULANATE 875-125 MG PO TABS: 1.0000 | ORAL_TABLET | Freq: Two times a day (BID) | ORAL | 0 refills | Status: AC

## 2023-10-14 NOTE — ED Triage Notes (Signed)
 Pt came in via POV d/t an abscess in his mouth on his lower Lt jaw area & states he has been told that he needs that tooth pulled. Also reports tasting the poison that is coming out of it, denies taking ABT for the infection has only take pain meds & tried to rinse his mouth out with peroxide. A/Ox4, rates his pain 3/10, states he is also an alcoholic & has been going through a lot since he lost his mother recently.

## 2023-10-14 NOTE — ED Triage Notes (Signed)
 Pt states he has an abscess on left side of jaw that started 3 days ago. C/O chills at night.

## 2023-10-14 NOTE — Discharge Instructions (Signed)
You have been seen by your caregiver because of dental pain.  SEEK MEDICAL ATTENTION IF: The exam and treatment you received today has been provided on an emergency basis only. This is not a substitute for complete medical or dental care. If your problem worsens or new symptoms (problems) appear, and you are unable to arrange prompt follow-up care with your dentist, call or return to this location. CALL YOUR DENTIST OR RETURN IMMEDIATELY IF you develop a fever, rash, difficulty breathing or swallowing, neck or facial swelling, or other potentially serious concerns.  

## 2023-10-14 NOTE — ED Notes (Signed)
 Pt ambulated to the room with a stable gait

## 2023-10-14 NOTE — ED Provider Notes (Signed)
 [place] EMERGENCY DEPARTMENT AT [facility] Provider Note   CSN: [id number] Arrival date & time: 10/14/23  1522     Patient presents with: Abscess   Wyatt Collins is a 53 y.o. male who presents with dental infection.  He has a history of dental decay.  He noticed some swelling below the jawline on the left.  He states that he has a foul taste in his mouth but has not actively noted any drainage from the area.  He is going to get his teeth pulled but was told by the dentist he needed to come here and get started on the antibiotic prior to being seen in the dental office.  He denies difficulty swallowing voice change difficulty opening his mouth fevers.    Abscess      Prior to Admission medications   Medication Sig Start Date End Date Taking? Authorizing Provider  amoxicillin -clavulanate (AUGMENTIN ) 875-125 MG tablet Take 1 tablet by mouth every 12 (twelve) hours. 10/14/23  Yes Celines Femia, PA-C  cyclobenzaprine  (FLEXERIL ) 10 MG tablet Take 1 tablet (10 mg total) by mouth 2 (two) times daily as needed for muscle spasms. Patient not taking: Reported on 04/23/2020 08/02/18   Nivia Colon, PA-C  diclofenac  sodium (VOLTAREN ) 1 % GEL Apply 2 g topically 4 (four) times daily. Patient not taking: Reported on 04/23/2020 08/02/18   Nivia Colon, PA-C  dicyclomine  (BENTYL ) 20 MG tablet Take 1 tablet (20 mg total) by mouth 2 (two) times daily. Patient not taking: Reported on 05/08/2020 04/23/20   Waylan Elsie PARAS, PA-C  famotidine  (PEPCID ) 20 MG tablet Take 20 mg by mouth daily. Patient not taking: Reported on 09/22/2022 07/06/19   [provider]  FLUoxetine  (PROZAC ) 20 MG capsule Take 1 capsule (20 mg total) by mouth daily. Patient not taking: Reported on 09/22/2022 05/08/20   Mayers, Cari S, PA-C  traZODone  (DESYREL ) 150 MG tablet Take 1 tablet (150 mg total) by mouth at bedtime. Patient not taking: Reported on 09/22/2022 05/08/20   Mayers, Kirk GORMAN, PA-C    Allergies:  Desipramine, Ibuprofen , and Tramadol    Review of Systems  Updated Vital Signs BP (!) 158/102 (BP Location: Right Arm)   Pulse (!) 110   Temp 98.2 F (36.8 C) (Oral)   Resp 18   Ht 5' 9 (1.753 m)   Wt 81.6 kg   SpO2 96%   BMI 26.58 kg/m   Physical Exam Vitals and nursing note reviewed.  Constitutional:      General: He is not in acute distress.    Appearance: He is well-developed. He is not diaphoretic.  HENT:     Head: Normocephalic and atraumatic.     Mouth/Throat:     Comments: Multiple molars with dental decay on the left side.  He has some tender submandibular lymphadenopathy without evidence of swelling.  He has some gingival erythema but no fluctuance or overt dental abscess.  No sublingual swelling or fullness, uvula is midline with normal phonation. Eyes:     General: No scleral icterus.    Conjunctiva/sclera: Conjunctivae normal.  Cardiovascular:     Rate and Rhythm: Normal rate and regular rhythm.     Heart sounds: Normal heart sounds.  Pulmonary:     Effort: Pulmonary effort is normal. No respiratory distress.     Breath sounds: Normal breath sounds.  Abdominal:     Palpations: Abdomen is soft.     Tenderness: There is no abdominal tenderness.  Musculoskeletal:  Cervical back: Normal range of motion and neck supple.  Skin:    General: Skin is warm and dry.  Neurological:     Mental Status: He is alert.  Psychiatric:        Behavior: Behavior normal.     (all labs ordered are listed, but only abnormal results are displayed) Labs Reviewed  COMPREHENSIVE METABOLIC PANEL WITH GFR - Abnormal; Notable for the following components:      Result Value   Potassium 3.3 (*)    Calcium 8.7 (*)    AST 119 (*)    ALT 72 (*)    All other components within normal limits  CBC WITH DIFFERENTIAL/PLATELET - Abnormal; Notable for the following components:   MCV 105.6 (*)    MCH 37.6 (*)    All other components within normal limits  I-STAT CG4 LACTIC ACID, ED   I-STAT CG4 LACTIC ACID, ED    EKG: None  Radiology: No results found.   Procedures   Medications Ordered in the ED - No data to display                                  Medical Decision Making Patient here with severe dental decay likely some associated early infection without overt abscess.  No evidence of Ludwig's angina.  Patient is a little bit tachycardic but has been here for about 4 hours he has a history of alcohol dependence AST and ALT are elevated in a 2:1 ratio assistant with his history of alcohol abuse.  He has macrocytosis without anemia and no leukocytosis he is afebrile and appears otherwise appropriate for discharge at this time.  Amount and/or Complexity of Data Reviewed Labs: ordered.        Final diagnoses:  Dental infection    ED Discharge Orders          Ordered    amoxicillin -clavulanate (AUGMENTIN ) 875-125 MG tablet  Every 12 hours        10/14/23 1908               Arloa Chroman, PA-C 10/14/23 1908    Rogelia Jerilynn RAMAN, MD 10/18/23 [phone]

## 2023-10-15 ENCOUNTER — Encounter (HOSPITAL_COMMUNITY): Payer: Self-pay | Admitting: *Deleted

## 2023-10-15 ENCOUNTER — Emergency Department (HOSPITAL_COMMUNITY): Payer: Self-pay

## 2023-10-15 ENCOUNTER — Emergency Department (HOSPITAL_COMMUNITY)
Admission: EM | Admit: 2023-10-15 | Discharge: 2023-10-15 | Discharge status: Against Medical Advice | Payer: Self-pay | Attending: Emergency Medicine | Admitting: Emergency Medicine

## 2023-10-15 ENCOUNTER — Other Ambulatory Visit: Payer: Self-pay

## 2023-10-15 DIAGNOSIS — R079 Chest pain, unspecified: Secondary | ICD-10-CM | POA: Insufficient documentation

## 2023-10-15 DIAGNOSIS — M549 Dorsalgia, unspecified: Secondary | ICD-10-CM | POA: Insufficient documentation

## 2023-10-15 DIAGNOSIS — Z5321 Procedure and treatment not carried out due to patient leaving prior to being seen by health care provider: Secondary | ICD-10-CM | POA: Insufficient documentation

## 2023-10-15 DIAGNOSIS — R0602 Shortness of breath: Secondary | ICD-10-CM | POA: Insufficient documentation

## 2023-10-15 DIAGNOSIS — R059 Cough, unspecified: Secondary | ICD-10-CM | POA: Insufficient documentation

## 2023-10-15 NOTE — ED Notes (Signed)
 Xray came to get him for a chest xray   he apparently has walked out

## 2023-10-15 NOTE — ED Notes (Signed)
 The pt has refused lab work

## 2023-10-15 NOTE — ED Triage Notes (Signed)
 Pt ambulatory to ER with SHOB, CP with cough and back pain.  Pt states symptoms started approx 30 mins ago.

## 2023-10-15 NOTE — ED Triage Notes (Signed)
 The pt has high anxiety he was here yesterday for a toothache he took his first antibiotic at 0930-100 this am  at 1500 he has sudden chest pain when he arrived to triage he was and is hyperventilating and he is scared that he is having  a reaction to the med    amox-clav

## 2023-10-15 NOTE — ED Notes (Signed)
 Apparently this pt was here yesterday and was very hard to deal with as he is today
